# Patient Record
Sex: Female | Born: 1990 | Race: Black or African American | Hispanic: No | Marital: Married | Smoking: Current every day smoker
Health system: Southern US, Community
[De-identification: ages and names within clinical notes are randomized; demographics above are authoritative.]

## PROBLEM LIST (undated history)

## (undated) DIAGNOSIS — F419 Anxiety disorder, unspecified: Secondary | ICD-10-CM

## (undated) DIAGNOSIS — E119 Type 2 diabetes mellitus without complications: Secondary | ICD-10-CM

## (undated) DIAGNOSIS — I10 Essential (primary) hypertension: Secondary | ICD-10-CM

## (undated) DIAGNOSIS — F32A Depression, unspecified: Secondary | ICD-10-CM

---
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
## 2008-01-07 ENCOUNTER — Inpatient Hospital Stay: Payer: Self-pay | Admitting: *Deleted

## 2008-02-23 ENCOUNTER — Inpatient Hospital Stay: Payer: Self-pay | Admitting: Specialist

## 2008-11-29 ENCOUNTER — Inpatient Hospital Stay: Payer: Self-pay | Admitting: *Deleted

## 2009-03-01 ENCOUNTER — Ambulatory Visit: Payer: Self-pay | Admitting: Nurse Practitioner

## 2009-08-14 ENCOUNTER — Emergency Department: Payer: Self-pay | Admitting: Unknown Physician Specialty

## 2009-09-16 ENCOUNTER — Emergency Department: Payer: Self-pay | Admitting: Internal Medicine

## 2018-04-15 ENCOUNTER — Encounter: Payer: Self-pay | Admitting: Emergency Medicine

## 2018-04-15 ENCOUNTER — Other Ambulatory Visit: Payer: Self-pay

## 2018-04-15 ENCOUNTER — Emergency Department
Admission: EM | Admit: 2018-04-15 | Discharge: 2018-04-15 | Disposition: A | Discharge status: Home / Self Care | Payer: 59 | Attending: Emergency Medicine | Admitting: Emergency Medicine

## 2018-04-15 ENCOUNTER — Emergency Department: Payer: 59

## 2018-04-15 DIAGNOSIS — E109 Type 1 diabetes mellitus without complications: Secondary | ICD-10-CM | POA: Diagnosis not present

## 2018-04-15 DIAGNOSIS — Z794 Long term (current) use of insulin: Secondary | ICD-10-CM | POA: Insufficient documentation

## 2018-04-15 DIAGNOSIS — B0223 Postherpetic polyneuropathy: Secondary | ICD-10-CM | POA: Diagnosis not present

## 2018-04-15 DIAGNOSIS — R0781 Pleurodynia: Secondary | ICD-10-CM

## 2018-04-15 DIAGNOSIS — I10 Essential (primary) hypertension: Secondary | ICD-10-CM | POA: Insufficient documentation

## 2018-04-15 DIAGNOSIS — Z79899 Other long term (current) drug therapy: Secondary | ICD-10-CM | POA: Insufficient documentation

## 2018-04-15 DIAGNOSIS — R079 Chest pain, unspecified: Secondary | ICD-10-CM | POA: Diagnosis present

## 2018-04-15 DIAGNOSIS — F17210 Nicotine dependence, cigarettes, uncomplicated: Secondary | ICD-10-CM | POA: Diagnosis not present

## 2018-04-15 HISTORY — DX: Type 2 diabetes mellitus without complications: E11.9

## 2018-04-15 HISTORY — DX: Essential (primary) hypertension: I10

## 2018-04-15 LAB — URINALYSIS, COMPLETE (UACMP) WITH MICROSCOPIC
Bilirubin Urine: NEGATIVE
Glucose, UA: >=500 mg/dL — AB
KETONES UR: NEGATIVE mg/dL
Leukocytes,Ua: NEGATIVE
Nitrite: NEGATIVE
Protein, ur: 100 mg/dL — AB
Specific Gravity, Urine: 1.01 (ref 1.005–1.030)
pH: 6 (ref 5.0–8.0)

## 2018-04-15 LAB — POCT PREGNANCY, URINE: Preg Test, Ur: NEGATIVE

## 2018-04-15 MED ORDER — FAMCICLOVIR 500 MG PO TABS: 500.0000 mg | ORAL_TABLET | Freq: Three times a day (TID) | ORAL | 0 refills | Status: AC

## 2018-04-15 MED ORDER — HYDROCODONE-ACETAMINOPHEN 5-325 MG PO TABS: 1.0000 | ORAL_TABLET | Freq: Four times a day (QID) | ORAL | 0 refills | Status: DC | PRN

## 2018-04-15 NOTE — ED Notes (Signed)
See triage note  Presents with right sided rib/chest pain pain  Pain started about 1 week ago  W/o injury  Pain increases with cough or deep breathing

## 2018-04-15 NOTE — ED Triage Notes (Addendum)
Patient presents to the ED with right sided rib pain.  Patient states x 1 week she has had sharp pain to her right ribs.  Area is tender on palpation.  Patient states pain is worse with coughing, sneezing and laughing.  Patient denies any known injury.

## 2018-04-15 NOTE — Discharge Instructions (Addendum)
Follow-up with your regular doctor if not better in 3 days.  If the pain intensifies and you still do not have a rash in the area please return the emergency department.  Take the medications as prescribed.

## 2018-04-15 NOTE — ED Provider Notes (Signed)
[facility] Emergency Department Provider Note  ____________________________________________   First MD Initiated Contact with Patient 04/15/18 1713     (approximate)  I have reviewed the triage vital signs and the nursing notes.   HISTORY  Chief Complaint Chest Pain    HPI Cheyenne Davenport is a 28 y.o. female presents emergency department complaining of right-sided rib/flank pain.  She states the pain radiates around from the back to the front on the rib area.  She denies any known injury.  She has not had a lot of coughing.  She denies any fever or chills.  She denies any painful urination.    Past Medical History:  Diagnosis Date  . Diabetes mellitus without complication (HCC)    type 1   . Hypertension     There are no active problems to display for this patient.   Past Surgical History:  Procedure Laterality Date  . CESAREAN SECTION      Prior to Admission medications   Medication Sig Start Date End Date Taking? Authorizing Provider  ARIPiprazole (ABILIFY) 5 MG tablet Take 5 mg by mouth daily.   Yes [provider]  busPIRone (BUSPAR) 10 MG tablet Take 10 mg by mouth 3 (three) times daily.   Yes [provider]  DULoxetine (CYMBALTA) 60 MG capsule Take 60 mg by mouth daily.   Yes [provider]  insulin aspart (NOVOLOG) 100 UNIT/ML injection Inject into the skin 3 (three) times daily before meals.   Yes [provider]  Insulin Degludec (TRESIBA) 100 UNIT/ML SOLN Inject into the skin.   Yes [provider]  famciclovir (FAMVIR) 500 MG tablet Take 1 tablet (500 mg total) by mouth 3 (three) times daily for 7 days. 04/15/18 04/22/18  , Roselyn Bering, PA-C  HYDROcodone-acetaminophen (NORCO/VICODIN) 5-325 MG tablet Take 1 tablet by mouth every 6 (six) hours as needed for moderate pain. 04/15/18   Faythe Ghee, PA-C    Allergies Patient has no known allergies.  No family history on file.  Social  History Social History   Tobacco Use  . Smoking status: Current Every Day Smoker    Packs/day: 1.00    Types: Cigarettes  . Smokeless tobacco: Never Used  Substance Use Topics  . Alcohol use: Not on file  . Drug use: Not on file    Review of Systems  Constitutional: No fever/chills Eyes: No visual changes. ENT: No sore throat. Respiratory: Denies cough Genitourinary: Negative for dysuria. Musculoskeletal: Negative for back pain.  Positive for right rib pain Skin: Negative for rash.    ____________________________________________   PHYSICAL EXAM:  VITAL SIGNS: ED Triage Vitals [04/15/18 1703]  Enc Vitals Group     BP (!) 143/95     Pulse Rate 85     Resp 18     Temp 98.3 F (36.8 C)     Temp Source Oral     SpO2 100 %     Weight 250 lb (113.4 kg)     Height  (1.626 m)     Head Circumference      Peak Flow      Pain Score 0     Pain Loc      Pain Edu?      Excl. in [place]?     Constitutional: Alert and oriented. Well appearing and in no acute distress. Eyes: Conjunctivae are normal.  Head: Atraumatic. Nose: No congestion/rhinnorhea. Mouth/Throat: Mucous membranes are moist.   Neck:  supple no  lymphadenopathy noted Cardiovascular: Normal rate, regular rhythm. Heart sounds are normal Respiratory: Normal respiratory effort.  No retractions, lungs c t a  Abd: soft nontender bs normal all 4 quad, no CVA tenderness GU: deferred Musculoskeletal: FROM all extremities, warm and well perfused Neurologic:  Normal speech and language.  Skin:  Skin is warm, dry and intact. No rash noted.  No herpetic or vesicular lesions were noted on the skin Psychiatric: Mood and affect are normal. Speech and behavior are normal.  ____________________________________________   LABS (all labs ordered are listed, but only abnormal results are displayed)  Labs Reviewed  URINALYSIS, COMPLETE (UACMP) WITH MICROSCOPIC - Abnormal; Notable for the following components:      Result  Value   Color, Urine YELLOW (*)    APPearance CLEAR (*)    Glucose, UA >=500 (*)    Hgb urine dipstick MODERATE (*)    Protein, ur 100 (*)    Bacteria, UA RARE (*)    All other components within normal limits  URINE CULTURE  POC URINE PREG, ED  POCT PREGNANCY, URINE   ____________________________________________   ____________________________________________  RADIOLOGY  Chest x-ray is normal  ____________________________________________   PROCEDURES  Procedure(s) performed: No  Procedures    ____________________________________________   INITIAL IMPRESSION / ASSESSMENT AND PLAN / ED COURSE  Pertinent labs & imaging results that were available during my care of the patient were reviewed by me and considered in my medical decision making (see chart for details).   Patient is 28 year old female presents emergency department complaining of right-sided rib pain.  No injury.  No recent URI.  Physical exam shows the right rib area to be tender, abdomen is nontender, lungs are clear to all station.  Explained findings to the patient.  A UA and chest x-ray were ordered.  The UA shows a large amount of hemoglobin and protein due to her kidney disease from her diabetes.  The chest x-ray is normal.  Explained all of these findings to the patient.  I explained to her that sometimes shingles will show up as just pain and no rash.  We will empirically treat her for shingles even though she does not have a rash.  If her pain is worsening or she notices blood that is visible in the urine she should return emergency department.  She states she understands and will comply.  She was discharged in stable condition with a prescription for Famvir and Vicodin.     As part of my medical decision making, I reviewed the following data within the electronic MEDICAL RECORD NUMBER History obtained from family, Nursing notes reviewed and incorporated, Labs reviewed UA shows hemoglobin in the urine,  Old chart reviewed, Radiograph reviewed chest x-ray is normal, Notes from prior ED visits and [place] Controlled Substance Database  ____________________________________________   FINAL CLINICAL IMPRESSION(S) / ED DIAGNOSES  Final diagnoses:  Rib pain on right side  Shingles (herpes zoster) polyneuropathy      NEW MEDICATIONS STARTED DURING THIS VISIT:  New Prescriptions   FAMCICLOVIR (FAMVIR) 500 MG TABLET    Take 1 tablet (500 mg total) by mouth 3 (three) times daily for 7 days.   HYDROCODONE-ACETAMINOPHEN (NORCO/VICODIN) 5-325 MG TABLET    Take 1 tablet by mouth every 6 (six) hours as needed for moderate pain.     Note:  This document was prepared using Dragon voice recognition software and may include unintentional dictation errors.    Faythe Ghee, PA-C 04/15/18 1833    Derrill Kay,  Dustin Flock, MD 04/16/18 [phone]

## 2018-04-16 LAB — URINE CULTURE

## 2019-06-05 ENCOUNTER — Ambulatory Visit: Payer: 59 | Attending: Internal Medicine

## 2019-06-05 ENCOUNTER — Other Ambulatory Visit: Payer: Self-pay

## 2019-06-05 DIAGNOSIS — Z23 Encounter for immunization: Secondary | ICD-10-CM

## 2019-06-05 NOTE — Progress Notes (Signed)
   Covid-19 Vaccination Clinic  Name:  Cheyenne Davenport    MRN: [id number] DOB: 1990-09-21  06/05/2019  Cheyenne Davenport was observed post Covid-19 immunization for 15 minutes without incident. She was provided with Vaccine Information Sheet and instruction to access the V-Safe system.   Cheyenne Davenport was instructed to call 911 with any severe reactions post vaccine: Marland Kitchen Difficulty breathing  . Swelling of face and throat  . A fast heartbeat  . A bad rash all over body  . Dizziness and weakness   Immunizations Administered    Name Date Dose VIS Date Route   Moderna COVID-19 Vaccine 06/05/2019  3:34 PM 0.5 mL 12/2018 Intramuscular   Manufacturer: Gala Murdoch   Lot: [id number]   NDC: [id number]

## 2019-07-03 ENCOUNTER — Ambulatory Visit: Payer: 59 | Attending: Internal Medicine

## 2019-07-03 DIAGNOSIS — Z23 Encounter for immunization: Secondary | ICD-10-CM

## 2019-07-03 NOTE — Progress Notes (Signed)
   Covid-19 Vaccination Clinic  Name:  Cheyenne Davenport    MRN: [id number] DOB: April 02, 1990  07/03/2019  [name] was observed post Covid-19 immunization for 15 minutes without incident. She was provided with Vaccine Information Sheet and instruction to access the V-Safe system.   [name] was instructed to call 911 with any severe reactions post vaccine: Marland Kitchen Difficulty breathing  . Swelling of face and throat  . A fast heartbeat  . A bad rash all over body  . Dizziness and weakness   Immunizations Administered    Name Date Dose VIS Date Route   Moderna COVID-19 Vaccine 07/03/2019  3:33 PM 0.5 mL 12/2018 Intramuscular   Manufacturer: Moderna   Lot: [id number]   NDC: 78242-353-61

## 2020-12-08 ENCOUNTER — Emergency Department: Payer: BLUE CROSS/BLUE SHIELD

## 2020-12-08 ENCOUNTER — Inpatient Hospital Stay
Admission: EM | Admit: 2020-12-08 | Discharge: 2020-12-10 | DRG: 638 | Disposition: A | Discharge status: Home / Self Care | Payer: BLUE CROSS/BLUE SHIELD | Attending: Hospitalist | Admitting: Hospitalist

## 2020-12-08 ENCOUNTER — Encounter: Payer: Self-pay | Admitting: Emergency Medicine

## 2020-12-08 DIAGNOSIS — F17210 Nicotine dependence, cigarettes, uncomplicated: Secondary | ICD-10-CM | POA: Diagnosis present

## 2020-12-08 DIAGNOSIS — F32A Depression, unspecified: Secondary | ICD-10-CM | POA: Diagnosis present

## 2020-12-08 DIAGNOSIS — R17 Unspecified jaundice: Secondary | ICD-10-CM | POA: Diagnosis present

## 2020-12-08 DIAGNOSIS — Z79899 Other long term (current) drug therapy: Secondary | ICD-10-CM

## 2020-12-08 DIAGNOSIS — Z6836 Body mass index (BMI) 36.0-36.9, adult: Secondary | ICD-10-CM

## 2020-12-08 DIAGNOSIS — E282 Polycystic ovarian syndrome: Secondary | ICD-10-CM | POA: Diagnosis present

## 2020-12-08 DIAGNOSIS — Z794 Long term (current) use of insulin: Secondary | ICD-10-CM | POA: Diagnosis not present

## 2020-12-08 DIAGNOSIS — Z888 Allergy status to other drugs, medicaments and biological substances status: Secondary | ICD-10-CM

## 2020-12-08 DIAGNOSIS — Z20822 Contact with and (suspected) exposure to covid-19: Secondary | ICD-10-CM | POA: Diagnosis present

## 2020-12-08 DIAGNOSIS — Z833 Family history of diabetes mellitus: Secondary | ICD-10-CM | POA: Diagnosis not present

## 2020-12-08 DIAGNOSIS — E876 Hypokalemia: Secondary | ICD-10-CM | POA: Diagnosis present

## 2020-12-08 DIAGNOSIS — Z8249 Family history of ischemic heart disease and other diseases of the circulatory system: Secondary | ICD-10-CM | POA: Diagnosis not present

## 2020-12-08 DIAGNOSIS — Z7984 Long term (current) use of oral hypoglycemic drugs: Secondary | ICD-10-CM

## 2020-12-08 DIAGNOSIS — E785 Hyperlipidemia, unspecified: Secondary | ICD-10-CM | POA: Diagnosis present

## 2020-12-08 DIAGNOSIS — Z23 Encounter for immunization: Secondary | ICD-10-CM | POA: Diagnosis not present

## 2020-12-08 DIAGNOSIS — E1022 Type 1 diabetes mellitus with diabetic chronic kidney disease: Secondary | ICD-10-CM | POA: Diagnosis present

## 2020-12-08 DIAGNOSIS — F419 Anxiety disorder, unspecified: Secondary | ICD-10-CM | POA: Diagnosis present

## 2020-12-08 DIAGNOSIS — E669 Obesity, unspecified: Secondary | ICD-10-CM | POA: Diagnosis present

## 2020-12-08 DIAGNOSIS — N1831 Chronic kidney disease, stage 3a: Secondary | ICD-10-CM | POA: Diagnosis present

## 2020-12-08 DIAGNOSIS — I129 Hypertensive chronic kidney disease with stage 1 through stage 4 chronic kidney disease, or unspecified chronic kidney disease: Secondary | ICD-10-CM | POA: Diagnosis present

## 2020-12-08 DIAGNOSIS — R1084 Generalized abdominal pain: Secondary | ICD-10-CM

## 2020-12-08 DIAGNOSIS — N1330 Unspecified hydronephrosis: Secondary | ICD-10-CM | POA: Diagnosis present

## 2020-12-08 DIAGNOSIS — E1165 Type 2 diabetes mellitus with hyperglycemia: Secondary | ICD-10-CM | POA: Diagnosis present

## 2020-12-08 DIAGNOSIS — Z9114 Patient's other noncompliance with medication regimen: Secondary | ICD-10-CM | POA: Diagnosis not present

## 2020-12-08 DIAGNOSIS — E1010 Type 1 diabetes mellitus with ketoacidosis without coma: Secondary | ICD-10-CM | POA: Diagnosis present

## 2020-12-08 HISTORY — DX: Depression, unspecified: F32.A

## 2020-12-08 HISTORY — DX: Anxiety disorder, unspecified: F41.9

## 2020-12-08 LAB — COMPREHENSIVE METABOLIC PANEL
ALT: 16 U/L (ref 0–44)
AST: 17 U/L (ref 15–41)
Albumin: 3.6 g/dL (ref 3.5–5.0)
Alkaline Phosphatase: 66 U/L (ref 38–126)
Anion gap: 18 — ABNORMAL HIGH (ref 5–15)
BUN: 18 mg/dL (ref 6–20)
CO2: 18 mmol/L — ABNORMAL LOW (ref 22–32)
Calcium: 9.3 mg/dL (ref 8.9–10.3)
Chloride: 97 mmol/L — ABNORMAL LOW (ref 98–111)
Creatinine, Ser: 1.3 mg/dL — ABNORMAL HIGH (ref 0.44–1.00)
GFR, Estimated: 56 mL/min — ABNORMAL LOW (ref >60)
Glucose, Bld: 430 mg/dL — ABNORMAL HIGH (ref 70–99)
Potassium: 4 mmol/L (ref 3.5–5.1)
Sodium: 133 mmol/L — ABNORMAL LOW (ref 135–145)
Total Bilirubin: 1.3 mg/dL — ABNORMAL HIGH (ref 0.3–1.2)
Total Protein: 8.4 g/dL — ABNORMAL HIGH (ref 6.5–8.1)

## 2020-12-08 LAB — BLOOD GAS, VENOUS
Acid-base deficit: 7 mmol/L — ABNORMAL HIGH (ref 0.0–2.0)
Bicarbonate: 15.3 mmol/L — ABNORMAL LOW (ref 20.0–28.0)
O2 Saturation: 65.8 %
Patient temperature: 37
pCO2, Ven: 23 mmHg — ABNORMAL LOW (ref 44.0–60.0)
pH, Ven: 7.43 (ref 7.250–7.430)
pO2, Ven: 33 mmHg (ref 32.0–45.0)

## 2020-12-08 LAB — CBG MONITORING, ED
Glucose-Capillary: 393 mg/dL — ABNORMAL HIGH (ref 70–99)
Glucose-Capillary: 400 mg/dL — ABNORMAL HIGH (ref 70–99)

## 2020-12-08 LAB — CREATININE, SERUM
Creatinine, Ser: 1.17 mg/dL — ABNORMAL HIGH (ref 0.44–1.00)
GFR, Estimated: >60 mL/min (ref >60)

## 2020-12-08 LAB — BASIC METABOLIC PANEL
Anion gap: 17 — ABNORMAL HIGH (ref 5–15)
Anion gap: 17 — ABNORMAL HIGH (ref 5–15)
BUN: 18 mg/dL (ref 6–20)
BUN: 19 mg/dL (ref 6–20)
CO2: 16 mmol/L — ABNORMAL LOW (ref 22–32)
CO2: 17 mmol/L — ABNORMAL LOW (ref 22–32)
Calcium: 8.9 mg/dL (ref 8.9–10.3)
Calcium: 8.8 mg/dL — ABNORMAL LOW (ref 8.9–10.3)
Chloride: 102 mmol/L (ref 98–111)
Chloride: 102 mmol/L (ref 98–111)
Creatinine, Ser: 1.17 mg/dL — ABNORMAL HIGH (ref 0.44–1.00)
Creatinine, Ser: 1.23 mg/dL — ABNORMAL HIGH (ref 0.44–1.00)
GFR, Estimated: >60 mL/min (ref >60)
GFR, Estimated: 60 mL/min — ABNORMAL LOW (ref >60)
Glucose, Bld: 239 mg/dL — ABNORMAL HIGH (ref 70–99)
Glucose, Bld: 210 mg/dL — ABNORMAL HIGH (ref 70–99)
Potassium: 3.3 mmol/L — ABNORMAL LOW (ref 3.5–5.1)
Potassium: 3.3 mmol/L — ABNORMAL LOW (ref 3.5–5.1)
Sodium: 135 mmol/L (ref 135–145)
Sodium: 136 mmol/L (ref 135–145)

## 2020-12-08 LAB — HEMOGLOBIN A1C
Hgb A1c MFr Bld: 10.9 % — ABNORMAL HIGH (ref 4.8–5.6)
Mean Plasma Glucose: 266.13 mg/dL

## 2020-12-08 LAB — PREGNANCY, URINE: Preg Test, Ur: NEGATIVE

## 2020-12-08 LAB — URINALYSIS, ROUTINE W REFLEX MICROSCOPIC
Bilirubin Urine: NEGATIVE
Glucose, UA: >=500 mg/dL — AB
Ketones, ur: 20 mg/dL — AB
Leukocytes,Ua: NEGATIVE
Nitrite: NEGATIVE
Protein, ur: 100 mg/dL — AB
Specific Gravity, Urine: 1.025 (ref 1.005–1.030)
pH: 5 (ref 5.0–8.0)

## 2020-12-08 LAB — GLUCOSE, CAPILLARY
Glucose-Capillary: 118 mg/dL — ABNORMAL HIGH (ref 70–99)
Glucose-Capillary: 151 mg/dL — ABNORMAL HIGH (ref 70–99)
Glucose-Capillary: 173 mg/dL — ABNORMAL HIGH (ref 70–99)
Glucose-Capillary: 184 mg/dL — ABNORMAL HIGH (ref 70–99)
Glucose-Capillary: 200 mg/dL — ABNORMAL HIGH (ref 70–99)
Glucose-Capillary: 220 mg/dL — ABNORMAL HIGH (ref 70–99)

## 2020-12-08 LAB — CBC
HCT: 43.1 % (ref 36.0–46.0)
HCT: 39.6 % (ref 36.0–46.0)
Hemoglobin: 14.6 g/dL (ref 12.0–15.0)
Hemoglobin: 13.4 g/dL (ref 12.0–15.0)
MCH: 29.1 pg (ref 26.0–34.0)
MCH: 29.4 pg (ref 26.0–34.0)
MCHC: 33.9 g/dL (ref 30.0–36.0)
MCHC: 33.8 g/dL (ref 30.0–36.0)
MCV: 86 fL (ref 80.0–100.0)
MCV: 86.8 fL (ref 80.0–100.0)
Platelets: 338 10*3/uL (ref 150–400)
Platelets: 309 10*3/uL (ref 150–400)
RBC: 5.01 MIL/uL (ref 3.87–5.11)
RBC: 4.56 MIL/uL (ref 3.87–5.11)
RDW: 13.3 % (ref 11.5–15.5)
RDW: 13.1 % (ref 11.5–15.5)
WBC: 12.2 10*3/uL — ABNORMAL HIGH (ref 4.0–10.5)
WBC: 12.5 10*3/uL — ABNORMAL HIGH (ref 4.0–10.5)
nRBC: 0 % (ref 0.0–0.2)
nRBC: 0 % (ref 0.0–0.2)

## 2020-12-08 LAB — BETA-HYDROXYBUTYRIC ACID
Beta-Hydroxybutyric Acid: 4.61 mmol/L — ABNORMAL HIGH (ref 0.05–0.27)
Beta-Hydroxybutyric Acid: 5.67 mmol/L — ABNORMAL HIGH (ref 0.05–0.27)

## 2020-12-08 LAB — MRSA NEXT GEN BY PCR, NASAL: MRSA by PCR Next Gen: DETECTED — AB

## 2020-12-08 LAB — LIPASE, BLOOD: Lipase: 32 U/L (ref 11–51)

## 2020-12-08 LAB — RESP PANEL BY RT-PCR (FLU A&B, COVID) ARPGX2
Influenza A by PCR: NEGATIVE
Influenza B by PCR: NEGATIVE
SARS Coronavirus 2 by RT PCR: NEGATIVE

## 2020-12-08 LAB — MAGNESIUM: Magnesium: 1.8 mg/dL (ref 1.7–2.4)

## 2020-12-08 MED ORDER — LACTATED RINGERS IV SOLN: INTRAVENOUS | Status: DC

## 2020-12-08 MED ORDER — HYDROMORPHONE HCL 1 MG/ML IJ SOLN: 0.5000 mg | INTRAMUSCULAR | Status: DC | PRN

## 2020-12-08 MED ORDER — CHLORHEXIDINE GLUCONATE CLOTH 2 % EX PADS
6.0000 | MEDICATED_PAD | Freq: Every day | CUTANEOUS | Status: DC
  Filled 2020-12-08: qty 6

## 2020-12-08 MED ORDER — POTASSIUM CHLORIDE 10 MEQ/100ML IV SOLN
10.0000 meq | INTRAVENOUS | Status: AC
  Filled 2020-12-08: qty 100

## 2020-12-08 MED ORDER — ONDANSETRON HCL 4 MG/2ML IJ SOLN
4.0000 mg | Freq: Four times a day (QID) | INTRAMUSCULAR | Status: DC | PRN
  Administered 2020-12-09: 4 mg via INTRAVENOUS
  Filled 2020-12-08: qty 2

## 2020-12-08 MED ORDER — ENOXAPARIN SODIUM 60 MG/0.6ML IJ SOSY
0.5000 mg/kg | PREFILLED_SYRINGE | INTRAMUSCULAR | Status: DC
  Administered 2020-12-08: 50 mg via SUBCUTANEOUS
  Filled 2020-12-08: qty 0.5

## 2020-12-08 MED ORDER — POLYETHYLENE GLYCOL 3350 17 G PO PACK: 17.0000 g | PACK | Freq: Every day | ORAL | Status: DC | PRN

## 2020-12-08 MED ORDER — LACTATED RINGERS IV BOLUS: 20.0000 mL/kg | Freq: Once | INTRAVENOUS | Status: DC

## 2020-12-08 MED ORDER — SODIUM CHLORIDE 0.9 % IV SOLN
12.5000 mg | Freq: Four times a day (QID) | INTRAVENOUS | Status: DC | PRN
  Administered 2020-12-09: 12.5 mg via INTRAVENOUS
  Filled 2020-12-08: qty 12.5
  Filled 2020-12-08: qty 0.5

## 2020-12-08 MED ORDER — OXYCODONE HCL 5 MG PO TABS: 5.0000 mg | ORAL_TABLET | ORAL | Status: DC | PRN

## 2020-12-08 MED ORDER — HYDRALAZINE HCL 10 MG PO TABS
10.0000 mg | ORAL_TABLET | Freq: Three times a day (TID) | ORAL | Status: DC
  Administered 2020-12-08 – 2020-12-10 (×5): 10 mg via ORAL
  Filled 2020-12-08 (×8): qty 1

## 2020-12-08 MED ORDER — NIFEDIPINE ER OSMOTIC RELEASE 30 MG PO TB24
90.0000 mg | ORAL_TABLET | Freq: Every day | ORAL | Status: DC
  Administered 2020-12-09 – 2020-12-10 (×2): 90 mg via ORAL
  Filled 2020-12-08 (×2): qty 1
  Filled 2020-12-08: qty 3
  Filled 2020-12-08 (×7): qty 1

## 2020-12-08 MED ORDER — MUPIROCIN 2 % EX OINT
1.0000 "application " | TOPICAL_OINTMENT | Freq: Two times a day (BID) | CUTANEOUS | Status: DC
  Administered 2020-12-08 – 2020-12-09 (×3): 1 via NASAL
  Filled 2020-12-08: qty 22

## 2020-12-08 MED ORDER — PROCHLORPERAZINE EDISYLATE 10 MG/2ML IJ SOLN
10.0000 mg | Freq: Once | INTRAMUSCULAR | Status: AC
  Administered 2020-12-08: 10 mg via INTRAVENOUS
  Filled 2020-12-08: qty 2

## 2020-12-08 MED ORDER — ONDANSETRON HCL 4 MG/2ML IJ SOLN
4.0000 mg | Freq: Once | INTRAMUSCULAR | Status: AC
  Administered 2020-12-08: 4 mg via INTRAVENOUS
  Filled 2020-12-08: qty 2

## 2020-12-08 MED ORDER — LACTATED RINGERS IV BOLUS
1000.0000 mL | Freq: Once | INTRAVENOUS | Status: AC
  Administered 2020-12-08: 1000 mL via INTRAVENOUS

## 2020-12-08 MED ORDER — INSULIN REGULAR(HUMAN) IN NACL 100-0.9 UT/100ML-% IV SOLN
INTRAVENOUS | Status: DC
  Administered 2020-12-08: 1.1 [IU]/h via INTRAVENOUS

## 2020-12-08 MED ORDER — CARVEDILOL 25 MG PO TABS
25.0000 mg | ORAL_TABLET | Freq: Two times a day (BID) | ORAL | Status: DC
  Administered 2020-12-09 – 2020-12-10 (×3): 25 mg via ORAL
  Filled 2020-12-08: qty 2
  Filled 2020-12-08 (×2): qty 1

## 2020-12-08 MED ORDER — CHLORHEXIDINE GLUCONATE CLOTH 2 % EX PADS
6.0000 | MEDICATED_PAD | Freq: Every day | CUTANEOUS | Status: DC
  Administered 2020-12-09 – 2020-12-10 (×2): 6 via TOPICAL

## 2020-12-08 MED ORDER — POTASSIUM CHLORIDE 10 MEQ/100ML IV SOLN
10.0000 meq | INTRAVENOUS | Status: AC
  Administered 2020-12-08 (×2): 10 meq via INTRAVENOUS
  Filled 2020-12-08: qty 100

## 2020-12-08 MED ORDER — ENOXAPARIN SODIUM 40 MG/0.4ML IJ SOSY: 40.0000 mg | PREFILLED_SYRINGE | INTRAMUSCULAR | Status: DC

## 2020-12-08 MED ORDER — SERTRALINE HCL 50 MG PO TABS
100.0000 mg | ORAL_TABLET | Freq: Every day | ORAL | Status: DC
  Administered 2020-12-09 – 2020-12-10 (×2): 100 mg via ORAL
  Filled 2020-12-08 (×2): qty 2

## 2020-12-08 MED ORDER — DEXTROSE 50 % IV SOLN: 0.0000 mL | INTRAVENOUS | Status: DC | PRN

## 2020-12-08 MED ORDER — IOHEXOL 300 MG/ML  SOLN
100.0000 mL | Freq: Once | INTRAMUSCULAR | Status: AC | PRN
  Administered 2020-12-08: 100 mL via INTRAVENOUS
  Filled 2020-12-08: qty 100

## 2020-12-08 MED ORDER — MORPHINE SULFATE (PF) 4 MG/ML IV SOLN
4.0000 mg | Freq: Once | INTRAVENOUS | Status: AC
  Administered 2020-12-08: 4 mg via INTRAVENOUS
  Filled 2020-12-08: qty 1

## 2020-12-08 MED ORDER — ACETAMINOPHEN 325 MG PO TABS: 650.0000 mg | ORAL_TABLET | Freq: Four times a day (QID) | ORAL | Status: DC | PRN

## 2020-12-08 MED ORDER — DEXTROSE IN LACTATED RINGERS 5 % IV SOLN: INTRAVENOUS | Status: DC

## 2020-12-08 MED ORDER — PNEUMOCOCCAL VAC POLYVALENT 25 MCG/0.5ML IJ INJ
0.5000 mL | INJECTION | INTRAMUSCULAR | Status: AC
  Administered 2020-12-09: 0.5 mL via INTRAMUSCULAR
  Filled 2020-12-08: qty 0.5

## 2020-12-08 MED ORDER — BUSPIRONE HCL 10 MG PO TABS
10.0000 mg | ORAL_TABLET | Freq: Two times a day (BID) | ORAL | Status: DC
  Administered 2020-12-08 – 2020-12-10 (×4): 10 mg via ORAL
  Filled 2020-12-08 (×6): qty 1

## 2020-12-08 MED ORDER — INSULIN REGULAR(HUMAN) IN NACL 100-0.9 UT/100ML-% IV SOLN
INTRAVENOUS | Status: DC
  Administered 2020-12-08: 10.5 [IU]/h via INTRAVENOUS
  Filled 2020-12-08: qty 100

## 2020-12-08 MED ORDER — TRAZODONE HCL 50 MG PO TABS
50.0000 mg | ORAL_TABLET | Freq: Every evening | ORAL | Status: DC | PRN
  Administered 2020-12-08 – 2020-12-09 (×2): 50 mg via ORAL
  Filled 2020-12-08 (×2): qty 1

## 2020-12-08 MED ORDER — METFORMIN HCL ER 500 MG PO TB24
1000.0000 mg | ORAL_TABLET | Freq: Two times a day (BID) | ORAL | Status: DC
  Filled 2020-12-08: qty 2

## 2020-12-08 NOTE — ED Notes (Signed)
REPIORT CALLED TO RUSSEL RN ALL QUESTIONS ANSWERED

## 2020-12-08 NOTE — Progress Notes (Signed)
PHARMACIST - PHYSICIAN COMMUNICATION  CONCERNING:  Enoxaparin (Lovenox) for DVT Prophylaxis    RECOMMENDATION: Patient was prescribed enoxaprin 40mg  q24 hours for VTE prophylaxis.   Filed Weights   12/08/20 1311  Weight: 97.5 kg (215 lb)    Body mass index is 36.9 kg/m.  Estimated Creatinine Clearance: 70.4 mL/min (A) (by C-G formula based on SCr of 1.3 mg/dL (H)).   Based on [facility] policy patient is candidate for enoxaparin 0.5mg /kg TBW SQ every 24 hours based on BMI being >30.   DESCRIPTION: Pharmacy has adjusted enoxaparin dose per [facility] policy.  Patient is now receiving enoxaparin 50 mg every 24 hours    [facility], PharmD Clinical Pharmacist  12/08/2020 4:33 PM

## 2020-12-08 NOTE — ED Provider Notes (Signed)
[facility] Emergency Department Provider Note   ____________________________________________   Event Date/Time   First MD Initiated Contact with Patient 12/08/20 1345     (approximate)  I have reviewed the triage vital signs and the nursing notes.   HISTORY  Chief Complaint Emesis and Diarrhea    HPI Cheyenne Davenport is a 30 y.o. female with past medical history of hypertension and diabetes who presents to the ED complaining of abdominal pain and vomiting.  Patient reports that she has had 2 days of worsening nausea, vomiting, and diarrhea.  She has not noticed any blood in her emesis or stool.  She reports increasing abdominal pain over the course of today which affects her abdomen diffusely.  Pain is constant and sharp, has become severe since arrival to the ED.  She has not had any fevers, cough, chest pain, shortness of breath, dysuria, or flank pain.  She is not aware of any sick contacts.  She states she was tolerating her medications up until earlier today, has not had her blood pressure medications or insulin today.  She is a type I diabetic and reports episodes of DKA in the past, but current symptoms do not feel similar.        Past Medical History:  Diagnosis Date   Anxiety    Depression    Diabetes mellitus without complication (HCC)    type 1    Hypertension     There are no problems to display for this patient.   Past Surgical History:  Procedure Laterality Date   CESAREAN SECTION      Prior to Admission medications   Medication Sig Start Date End Date Taking? Authorizing Provider  ARIPiprazole (ABILIFY) 5 MG tablet Take 5 mg by mouth daily.    [provider]  busPIRone (BUSPAR) 10 MG tablet Take 10 mg by mouth 3 (three) times daily.    [provider]  DULoxetine (CYMBALTA) 60 MG capsule Take 60 mg by mouth daily.    [provider]  HYDROcodone-acetaminophen (NORCO/VICODIN) 5-325 MG tablet Take 1  tablet by mouth every 6 (six) hours as needed for moderate pain. 04/15/18   Fisher, Roselyn Bering, PA-C  insulin aspart (NOVOLOG) 100 UNIT/ML injection Inject into the skin 3 (three) times daily before meals.    [provider]  Insulin Degludec (TRESIBA) 100 UNIT/ML SOLN Inject into the skin.    [provider]    Allergies Lisinopril  No family history on file.  Social History Social History   Tobacco Use   Smoking status: Every Day    Packs/day: 1.00    Types: Cigarettes   Smokeless tobacco: Never    Review of Systems  Constitutional: No fever/chills Eyes: No visual changes. ENT: No sore throat. Cardiovascular: Denies chest pain. Respiratory: Denies shortness of breath. Gastrointestinal: Positive for abdominal pain, nausea, vomiting, and diarrhea.  No constipation. Genitourinary: Negative for dysuria. Musculoskeletal: Negative for back pain. Skin: Negative for rash. Neurological: Negative for headaches, focal weakness or numbness.  ____________________________________________   PHYSICAL EXAM:  VITAL SIGNS: ED Triage Vitals  Enc Vitals Group     BP 12/08/20 1310 (!) 164/115     Pulse Rate 12/08/20 1310 93     Resp 12/08/20 1310 18     Temp 12/08/20 1310 98.6 F (37 C)     Temp Source 12/08/20 1310 Oral     SpO2 12/08/20 1310 100 %     Weight 12/08/20 1311 215 lb (97.5 kg)  Height 12/08/20 1311 5\' 4"  (1.626 m)     Head Circumference --      Peak Flow --      Pain Score 12/08/20 1311 8     Pain Loc --      Pain Edu? --      Excl. in [place]? --     Constitutional: Alert and oriented. Eyes: Conjunctivae are normal. Head: Atraumatic. Nose: No congestion/rhinnorhea. Mouth/Throat: Mucous membranes are moist. Neck: Normal ROM Cardiovascular: Normal rate, regular rhythm. Grossly normal heart sounds.  2+ radial pulses bilaterally. Respiratory: Tachypneic with normal respiratory effort.  No retractions. Lungs CTAB. Gastrointestinal: Soft and  diffusely tender to palpation with no rebound or guarding.  No distention. Genitourinary: deferred Musculoskeletal: No lower extremity tenderness nor edema. Neurologic:  Normal speech and language. No gross focal neurologic deficits are appreciated. Skin:  Skin is warm, dry and intact. No rash noted. Psychiatric: Mood and affect are normal. Speech and behavior are normal.  ____________________________________________   LABS (all labs ordered are listed, but only abnormal results are displayed)  Labs Reviewed  COMPREHENSIVE METABOLIC PANEL - Abnormal; Notable for the following components:      Result Value   Sodium 133 (*)    Chloride 97 (*)    CO2 18 (*)    Glucose, Bld 430 (*)    Creatinine, Ser 1.30 (*)    Total Protein 8.4 (*)    Total Bilirubin 1.3 (*)    GFR, Estimated 56 (*)    Anion gap 18 (*)    All other components within normal limits  CBC - Abnormal; Notable for the following components:   WBC 12.2 (*)    All other components within normal limits  URINALYSIS, ROUTINE W REFLEX MICROSCOPIC - Abnormal; Notable for the following components:   Color, Urine YELLOW (*)    APPearance CLEAR (*)    Glucose, UA >=500 (*)    Hgb urine dipstick MODERATE (*)    Ketones, ur 20 (*)    Protein, ur 100 (*)    Bacteria, UA RARE (*)    All other components within normal limits  BLOOD GAS, VENOUS - Abnormal; Notable for the following components:   pCO2, Ven 23 (*)    Bicarbonate 15.3 (*)    Acid-base deficit 7.0 (*)    All other components within normal limits  BETA-HYDROXYBUTYRIC ACID - Abnormal; Notable for the following components:   Beta-Hydroxybutyric Acid 4.61 (*)    All other components within normal limits  CBG MONITORING, ED - Abnormal; Notable for the following components:   Glucose-Capillary 393 (*)    All other components within normal limits  CBG MONITORING, ED - Abnormal; Notable for the following components:   Glucose-Capillary 400 (*)    All other components  within normal limits  RESP PANEL BY RT-PCR (FLU A&B, COVID) ARPGX2  LIPASE, BLOOD  PREGNANCY, URINE  MAGNESIUM  POC URINE PREG, ED   ____________________________________________  EKG  ED ECG REPORT I, Blake Divine, the attending physician, personally viewed and interpreted this ECG.   Date: 12/08/2020  EKG Time: 15:05  Rate: 90  Rhythm: normal sinus rhythm  Axis: Normal  Intervals:none  ST&T Change: None   PROCEDURES  Procedure(s) performed (including Critical Care):  .Critical Care Performed by: Blake Divine, MD Authorized by: Blake Divine, MD   Critical care provider statement:    Critical care time (minutes):  45   Critical care time was exclusive of:  Separately billable procedures and treating other patients  and teaching time   Critical care was necessary to treat or prevent imminent or life-threatening deterioration of the following conditions:  Endocrine crisis and metabolic crisis   Critical care was time spent personally by me on the following activities:  Ordering and performing treatments and interventions, ordering and review of laboratory studies, ordering and review of radiographic studies, pulse oximetry, re-evaluation of patient's condition, review of old charts, obtaining history from patient or surrogate, examination of patient, evaluation of patient's response to treatment and development of treatment plan with patient or surrogate   I assumed direction of critical care for this patient from another provider in my specialty: no     Care discussed with: admitting provider     ____________________________________________   INITIAL IMPRESSION / [place] / ED COURSE      30 year old female with past medical history of hypertension and type 1 diabetes who presents to the ED with 2 days of nausea, vomiting, and diarrhea now associated with increasing abdominal pain over the course of the day today.  Patient is tachypneic but with normal  respiratory effort, maintaining O2 sats on room air.  She is diffusely tender to palpation on exam of her abdomen.  Initial labs are concerning for DKA with hyperglycemia, increased anion gap, and acidosis.  We will give bolus of IV fluids and start insulin drip with supplemental potassium.  It is unclear if gastroenteritis precipitated her DKA with associated abdominal pain or whether there is another cause of her abdominal pain precipitating DKA.  We will further assess with CT scan, pregnancy testing is pending at this time.  We will treat symptomatically with IV morphine and Zofran.  CT scan appears consistent with colitis but no other findings to account for patient's severe abdominal pain.  She is starting to feel better following IV fluids, insulin drip, and additional nausea medication.  UA shows no signs of infection and EKG is unremarkable.  Case discussed with hospitalist for admission for further management of DKA.      ____________________________________________   FINAL CLINICAL IMPRESSION(S) / ED DIAGNOSES  Final diagnoses:  Diabetic ketoacidosis without coma associated with type 1 diabetes mellitus ([place])  Generalized abdominal pain     ED Discharge Orders     None        Note:  This document was prepared using Dragon voice recognition software and may include unintentional dictation errors.    Blake Divine, MD 12/08/20 [phone]

## 2020-12-08 NOTE — ED Triage Notes (Signed)
Pt reports n/v/d since Monday and not being able to take her meds. Hx of DM and HTN.

## 2020-12-08 NOTE — H&P (Addendum)
History and Physical  Cheyenne Davenport RK:[id number] DOB: 1990-10-02 DOA: 12/08/2020  Referring physician: Dr. Charna Archer, [place] PCP: Rutherford Limerick, PA  Outpatient Specialists: Endocrinology, nephrology, gynecology, psychiatry. Patient coming from: Home.  Chief Complaint: Nausea, vomiting, abdominal pain x2 days.  HPI: Cheyenne Davenport is a 30 y.o. female with medical history significant for type 1 diabetes, essential hypertension, PCOS on metformin, hyperlipidemia, chronic anxiety/depression, obesity, who presented to [facility] ED with complaints of nausea, vomiting, abdominal pain, and watery stools of 3 days duration.  No subjective fevers but admits to chills.  Has been unable to keep anything down for the past 2 days including her home medications.  Her abdominal pain has been worsening for the past 24 hours.  It is diffuse, sharp and constant.  Associated with 3 days of watery stools with increase frequency.  At the time of this visit she has had 3 watery stools since this morning.  Upon presentation to the ED, she was in severe pain.  Work-up revealed DKA type I, mild colitis, and mild right hydronephrosis with no obstructing lesion identified such as a stone.  She was started on DKA protocol with IV fluid hydration in the ED.  EDP requested admission for further evaluation and management of her present condition.  ED Course: Temperature 98.6.  BP 154/70, pulse 85, respiration rate 17, saturation 98% on room air.  Lab studies remarkable for serum sodium 133, serum bicarb 18, glucose 430, creatinine 1.30, GFR 56, anion gap 18, bilirubin 1.3.  WBC 12.2.  Review of Systems: Review of systems as noted in the HPI. All other systems reviewed and are negative.   Past Medical History:  Diagnosis Date   Anxiety    Depression    Diabetes mellitus without complication (Cheyenne Davenport)    type 1    Hypertension    Past Surgical History:  Procedure Laterality Date   CESAREAN SECTION      Social History:  reports  that she has been smoking cigarettes. She has been smoking an average of 1 pack per day. She has never used smokeless tobacco. No history on file for alcohol use and drug use.   Allergies  Allergen Reactions   Lisinopril Nausea Only    Family history: Mother with history of diabetes and hypertension Father with history of diabetes and hypertension Sister with history of hypertension.    Prior to Admission medications   Medication Sig Start Date End Date Taking? Authorizing Provider  ARIPiprazole (ABILIFY) 5 MG tablet Take 5 mg by mouth daily.    [provider]  busPIRone (BUSPAR) 10 MG tablet Take 10 mg by mouth 3 (three) times daily.    [provider]  DULoxetine (CYMBALTA) 60 MG capsule Take 60 mg by mouth daily.    [provider]  HYDROcodone-acetaminophen (NORCO/VICODIN) 5-325 MG tablet Take 1 tablet by mouth every 6 (six) hours as needed for moderate pain. 04/15/18   Cheyenne, Linden Dolin, PA-C  insulin aspart (NOVOLOG) 100 UNIT/ML injection Inject into the skin 3 (three) times daily before meals.    [provider]  Insulin Degludec (TRESIBA) 100 UNIT/ML SOLN Inject into the skin.    [provider]    Physical Exam: BP (!) 155/70 (BP Location: Right Arm)   Pulse 85   Temp 98.3 F (36.8 C) (Oral)   Resp 17   Ht 5\' 4"  (1.626 m)   Wt 97.5 kg   SpO2 98%   BMI 36.90 kg/m   General: 30 y.o. year-old  female well developed well nourished in no acute distress.  Alert and oriented x3. Cardiovascular: Regular rate and rhythm with no rubs or gallops.  No thyromegaly or JVD noted.  No lower extremity edema. 2/4 pulses in all 4 extremities. Respiratory: Clear to auscultation with no wheezes or rales. Good inspiratory effort. Abdomen: Soft diffuse tenderness nondistended with normal bowel sounds x4 quadrants. Muskuloskeletal: No cyanosis, clubbing or edema noted bilaterally Neuro: CN II-XII intact, strength, sensation, reflexes Skin: No  ulcerative lesions noted or rashes Psychiatry: Judgement and insight appear normal. Mood is appropriate for condition and setting          Labs on Admission:  Basic Metabolic Panel: Recent Labs  Lab 12/08/20 1316 12/08/20 1406  NA 133*  --   K 4.0  --   CL 97*  --   CO2 18*  --   GLUCOSE 430*  --   BUN 18  --   CREATININE 1.30*  --   CALCIUM 9.3  --   MG  --  1.8   Liver Function Tests: Recent Labs  Lab 12/08/20 1316  AST 17  ALT 16  ALKPHOS 66  BILITOT 1.3*  PROT 8.4*  ALBUMIN 3.6   Recent Labs  Lab 12/08/20 1316  LIPASE 32   No results for input(s): AMMONIA in the last 168 hours. CBC: Recent Labs  Lab 12/08/20 1316  WBC 12.2*  HGB 14.6  HCT 43.1  MCV 86.0  PLT 338   Cardiac Enzymes: No results for input(s): CKTOTAL, CKMB, CKMBINDEX, TROPONINI in the last 168 hours.  BNP (last 3 results) No results for input(s): BNP in the last 8760 hours.  ProBNP (last 3 results) No results for input(s): PROBNP in the last 8760 hours.  CBG: Recent Labs  Lab 12/08/20 1314 12/08/20 1550  GLUCAP 393* 400*    Radiological Exams on Admission: CT Abdomen Pelvis W Contrast  Result Date: 12/08/2020 CLINICAL DATA:  Abdominal pain, acute, nonlocalized EXAM: CT ABDOMEN AND PELVIS WITH CONTRAST TECHNIQUE: Multidetector CT imaging of the abdomen and pelvis was performed using the standard protocol following bolus administration of intravenous contrast. CONTRAST:  115mL OMNIPAQUE IOHEXOL 300 MG/ML  SOLN COMPARISON:  None. FINDINGS: Lower chest: No acute abnormality. Hepatobiliary: No focal liver abnormality is seen. No gallstones, gallbladder wall thickening, or biliary dilatation. Pancreas: Unremarkable. No pancreatic ductal dilatation or surrounding inflammatory changes. Spleen: Normal in size without focal abnormality. Adrenals/Urinary Tract: Adrenal glands are unremarkable. There is mild right pelvicaliectasis with decompressed ureter and no evidence of obstructing stone.  Bladder is unremarkable. Stomach/Bowel: The stomach is within normal limits. There is no evidence of bowel obstruction. The appendix is normal. There is diffuse submucosal thickening of the colon including the rectum, with relative sparing of the sigmoid colon. Vascular/Lymphatic: No significant vascular findings are present. No enlarged abdominal or pelvic lymph nodes. Reproductive: Unremarkable. Other: No abdominal wall hernia or abnormality. No abdominopelvic ascites. Musculoskeletal: No acute osseous abnormality. No suspicious lytic or blastic lesions. Mild degenerative disc height loss at L5-S1. IMPRESSION: Mild diffuse colonic wall thickening including the rectum, with relative sparing of the sigmoid colon. This is consistent with mild colitis. No evidence of bowel obstruction.  Normal appendix. Mild right hydronephrosis with decompressed ureter and no obstructing lesion identified such as a stone. 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 This could be further evaluated with a Mag-3 nuclear medicine renal study and/or CT urogram. Electronically Signed   By: Maurine Simmering M.D.   On: 12/08/2020 15:45  EKG: I independently viewed the EKG done and my findings are as followed: Sinus rhythm rate of 90, nonspecific ST-T changes.  QTc 467.  Assessment/Plan Present on Admission:  DKA, type 1 (HCC)  Active Problems:   DKA, type 1 (HCC)  DKA type I possibly precipitated by colitis Presented with nausea vomiting and inability to tolerate po or take her medications. Did not take her insulin today. Mild colitis seen on CT scan. Serum bicarb 18, anion gap 18. Serum glucose 430 Beta hydroxybutyric acid 4.61 Obtain hemoglobin A1c Started on DKA protocol in the ED, continue Continue IV fluid hydration Transition to subcu insulin when appropriate. BMP every 4 hours, beta hydroxybutyrate acid every 8 hours. Replete electrolytes as indicated  Anion gap metabolic acidosis in the setting of  DKA type I. Presented with serum bicarb of 18 and anion gap of 18 Treat underlying conditions Continue IV fluid hydration. Repeat BMP every 4 hours until anion gap closes.  Newly diagnosed mild colitis/intractable abdominal pain Lipase normal, 32. Endorses watery stools for the past 3 days. Afebrile, non septic appearing Monitor for now off antibiotics Obtain stool studies. Monitor fever curve and WBC Treat symptomatically, pain control, IV fluid hydration, optimize electrolytes. Consult GI  Newly diagnosed mild right hydronephrosis seen on CT scan CT scan shows mild right hydronephrosis with decompressed ureter and no obstructing lesions identified such as a stone. Monitor urine output Consult urology  CKD 3A She appears to be at her baseline creatinine 1.3 with GFR 56 Avoid nephrotoxic agents, dehydration and hypotension Monitor urine output Repeat BMP  Chronic anxiety/depression Resume home duloxetine and home buspirone. She follows with psychiatry outpatient  Obesity BMI 36 Recommend weight loss outpatient with regular physical activity and healthy dieting.  PCOS on metformin She follows with gynecology outpatient Patient has 1 child Urine pregnancy negative.  Pseudohyponatremia Corrected sodium 133 for hyperglycemia 430, is 138 Continue LR IV fluid hydration.  Hyperbilirubinemia, isolated Alkaline phosphatase, ALT, AST normal T bili 1.3 Monitor for now     Critical care time: 65 minutes   DVT prophylaxis: Subcu Lovenox daily  Code Status: Full code  Family Communication: None at bedside  Disposition Plan: Admitted to stepdown unit for insulin drip.  Consults called: Urology, GI, Diabetes coordinator via Epic.  Admission status: Inpatient status.  Patient will require at least 2 midnights for further evaluation and treatment of present condition.   Status is: Inpatient       Darlin Drop MD [facility] Pager [phone]  If  7PM-7AM, please contact night-coverage [email or web address] Password [email or web address]  12/08/2020, 4:11 PM

## 2020-12-09 LAB — COMPREHENSIVE METABOLIC PANEL
ALT: 11 U/L (ref 0–44)
AST: 13 U/L — ABNORMAL LOW (ref 15–41)
Albumin: 2.9 g/dL — ABNORMAL LOW (ref 3.5–5.0)
Alkaline Phosphatase: 48 U/L (ref 38–126)
Anion gap: 9 (ref 5–15)
BUN: 20 mg/dL (ref 6–20)
CO2: 20 mmol/L — ABNORMAL LOW (ref 22–32)
Calcium: 8.3 mg/dL — ABNORMAL LOW (ref 8.9–10.3)
Chloride: 103 mmol/L (ref 98–111)
Creatinine, Ser: 1.18 mg/dL — ABNORMAL HIGH (ref 0.44–1.00)
GFR, Estimated: >60 mL/min (ref >60)
Glucose, Bld: 151 mg/dL — ABNORMAL HIGH (ref 70–99)
Potassium: 3.3 mmol/L — ABNORMAL LOW (ref 3.5–5.1)
Sodium: 132 mmol/L — ABNORMAL LOW (ref 135–145)
Total Bilirubin: 1 mg/dL (ref 0.3–1.2)
Total Protein: 6.7 g/dL (ref 6.5–8.1)

## 2020-12-09 LAB — BASIC METABOLIC PANEL
Anion gap: 8 (ref 5–15)
Anion gap: 14 (ref 5–15)
BUN: 19 mg/dL (ref 6–20)
BUN: 19 mg/dL (ref 6–20)
CO2: 22 mmol/L (ref 22–32)
CO2: 20 mmol/L — ABNORMAL LOW (ref 22–32)
Calcium: 8.3 mg/dL — ABNORMAL LOW (ref 8.9–10.3)
Calcium: 8.8 mg/dL — ABNORMAL LOW (ref 8.9–10.3)
Chloride: 104 mmol/L (ref 98–111)
Chloride: 101 mmol/L (ref 98–111)
Creatinine, Ser: 1.31 mg/dL — ABNORMAL HIGH (ref 0.44–1.00)
Creatinine, Ser: 1.36 mg/dL — ABNORMAL HIGH (ref 0.44–1.00)
GFR, Estimated: 56 mL/min — ABNORMAL LOW (ref >60)
GFR, Estimated: 53 mL/min — ABNORMAL LOW (ref >60)
Glucose, Bld: 159 mg/dL — ABNORMAL HIGH (ref 70–99)
Glucose, Bld: 272 mg/dL — ABNORMAL HIGH (ref 70–99)
Potassium: 3.2 mmol/L — ABNORMAL LOW (ref 3.5–5.1)
Potassium: 4.1 mmol/L (ref 3.5–5.1)
Sodium: 134 mmol/L — ABNORMAL LOW (ref 135–145)
Sodium: 135 mmol/L (ref 135–145)

## 2020-12-09 LAB — GLUCOSE, CAPILLARY
Glucose-Capillary: 149 mg/dL — ABNORMAL HIGH (ref 70–99)
Glucose-Capillary: 161 mg/dL — ABNORMAL HIGH (ref 70–99)
Glucose-Capillary: 141 mg/dL — ABNORMAL HIGH (ref 70–99)
Glucose-Capillary: 177 mg/dL — ABNORMAL HIGH (ref 70–99)
Glucose-Capillary: 131 mg/dL — ABNORMAL HIGH (ref 70–99)
Glucose-Capillary: 217 mg/dL — ABNORMAL HIGH (ref 70–99)
Glucose-Capillary: 277 mg/dL — ABNORMAL HIGH (ref 70–99)
Glucose-Capillary: 153 mg/dL — ABNORMAL HIGH (ref 70–99)
Glucose-Capillary: 234 mg/dL — ABNORMAL HIGH (ref 70–99)

## 2020-12-09 LAB — BETA-HYDROXYBUTYRIC ACID: Beta-Hydroxybutyric Acid: 2.85 mmol/L — ABNORMAL HIGH (ref 0.05–0.27)

## 2020-12-09 LAB — CBC
HCT: 35.1 % — ABNORMAL LOW (ref 36.0–46.0)
Hemoglobin: 11.9 g/dL — ABNORMAL LOW (ref 12.0–15.0)
MCH: 29.2 pg (ref 26.0–34.0)
MCHC: 33.9 g/dL (ref 30.0–36.0)
MCV: 86 fL (ref 80.0–100.0)
Platelets: 320 10*3/uL (ref 150–400)
RBC: 4.08 MIL/uL (ref 3.87–5.11)
RDW: 13.3 % (ref 11.5–15.5)
WBC: 12 10*3/uL — ABNORMAL HIGH (ref 4.0–10.5)
nRBC: 0 % (ref 0.0–0.2)

## 2020-12-09 LAB — HIV ANTIBODY (ROUTINE TESTING W REFLEX): HIV Screen 4th Generation wRfx: NONREACTIVE

## 2020-12-09 LAB — PHOSPHORUS: Phosphorus: 3.4 mg/dL (ref 2.5–4.6)

## 2020-12-09 LAB — MAGNESIUM: Magnesium: 1.6 mg/dL — ABNORMAL LOW (ref 1.7–2.4)

## 2020-12-09 MED ORDER — INSULIN ASPART 100 UNIT/ML IJ SOLN
0.0000 [IU] | Freq: Three times a day (TID) | INTRAMUSCULAR | Status: DC
  Administered 2020-12-09: 2 [IU] via SUBCUTANEOUS
  Administered 2020-12-09: 3 [IU] via SUBCUTANEOUS
  Administered 2020-12-10: 2 [IU] via SUBCUTANEOUS
  Administered 2020-12-10: 12:00:00 3 [IU] via SUBCUTANEOUS
  Filled 2020-12-09 (×4): qty 1

## 2020-12-09 MED ORDER — ENOXAPARIN SODIUM 40 MG/0.4ML IJ SOSY
40.0000 mg | PREFILLED_SYRINGE | INTRAMUSCULAR | Status: DC
  Administered 2020-12-09: 22:00:00 40 mg via SUBCUTANEOUS
  Filled 2020-12-09: qty 0.4

## 2020-12-09 MED ORDER — INSULIN ASPART 100 UNIT/ML IJ SOLN
0.0000 [IU] | Freq: Three times a day (TID) | INTRAMUSCULAR | Status: DC
  Administered 2020-12-09: 5 [IU] via SUBCUTANEOUS
  Filled 2020-12-09: qty 1

## 2020-12-09 MED ORDER — POTASSIUM CHLORIDE CRYS ER 20 MEQ PO TBCR
40.0000 meq | EXTENDED_RELEASE_TABLET | Freq: Once | ORAL | Status: AC
  Administered 2020-12-09: 40 meq via ORAL
  Filled 2020-12-09: qty 2

## 2020-12-09 MED ORDER — INSULIN DETEMIR 100 UNIT/ML ~~LOC~~ SOLN
8.0000 [IU] | Freq: Two times a day (BID) | SUBCUTANEOUS | Status: DC
  Filled 2020-12-09: qty 0.08

## 2020-12-09 MED ORDER — LACTATED RINGERS IV SOLN: INTRAVENOUS | Status: AC

## 2020-12-09 MED ORDER — INSULIN GLARGINE-YFGN 100 UNIT/ML ~~LOC~~ SOLN
5.0000 [IU] | Freq: Every day | SUBCUTANEOUS | Status: DC
  Administered 2020-12-09: 5 [IU] via SUBCUTANEOUS
  Filled 2020-12-09: qty 0.05

## 2020-12-09 MED ORDER — INSULIN ASPART PROT & ASPART (70-30 MIX) 100 UNIT/ML ~~LOC~~ SUSP
12.0000 [IU] | Freq: Two times a day (BID) | SUBCUTANEOUS | Status: DC
  Administered 2020-12-09 – 2020-12-10 (×2): 12 [IU] via SUBCUTANEOUS
  Filled 2020-12-09 (×2): qty 10

## 2020-12-09 MED ORDER — INSULIN ASPART 100 UNIT/ML IJ SOLN
3.0000 [IU] | Freq: Three times a day (TID) | INTRAMUSCULAR | Status: DC
  Administered 2020-12-09: 3 [IU] via SUBCUTANEOUS
  Filled 2020-12-09: qty 1

## 2020-12-09 NOTE — Progress Notes (Signed)
Notified Amy Cox, DO , of Endotool recommedation to contact provider for transition orders and Anion Gap now 9, CBG 141.  D5LR @ 125 cc/hr infusing and insulin now at 1.3 units/hr per endotool. Awaiting new orders.

## 2020-12-09 NOTE — Progress Notes (Addendum)
PROGRESS NOTE    Cheyenne Davenport  [id number] DOB: 1990/03/01 DOA: 12/08/2020 PCP: Rutherford Limerick, PA  104A/104A-AA   Assessment & Plan:   Active Problems:   DKA, type 1 ([place])   Cheyenne Davenport is a 30 y.o. female with medical history significant for type 1 diabetes, essential hypertension, PCOS on metformin, hyperlipidemia, chronic anxiety/depression, obesity, who presented to [facility] ED with complaints of nausea, vomiting, abdominal pain, and watery stools of 3 days duration.  No subjective fevers but admits to chills.  Has been unable to keep anything down for the past 2 days including her home medications.  Her abdominal pain has been worsening for the past 24 hours.  It is diffuse, sharp and constant.  Associated with 3 days of watery stools with increase frequency.  At the time of this visit she has had 3 watery stools since this morning.  Upon presentation to the ED, she was in severe pain.  Work-up revealed DKA type I, mild colitis, and mild right hydronephrosis with no obstructing lesion identified such as a stone.  She was started on DKA protocol with IV fluid hydration in the ED.   DKA  type I DM Insulin non-compliance Contributed by colitis and insulin non-compliance.   --Home regimen is Levemir 13u BID and mealtime 6u TID.  Pt reported she takes Levemir at home but not mealtime coverage.   --on presentation, Serum bicarb 18, anion gap 18.  Serum glucose 430.  Beta hydroxybutyric acid 4.61 --off of insulin gtt this morning and transitioned to subQ insulin Plan: --cont IVF for now --diabetic coordinator for education --since pt can only reliably take insulin BID, switch regimen to 70/30 combo 12u BID   Newly diagnosed mild colitis N/V/D and abdominal pain Lipase normal, 32. Endorses watery stools for the past 3 days. Afebrile, non septic appearing --stool studies were not obtained since pt had no diarrhea since presentation. Plan: --cont IVF for now --hold off abx for  now   Newly diagnosed mild right hydronephrosis seen on CT scan CT scan shows mild right hydronephrosis with decompressed ureter and no obstructing lesions identified such as a stone. Monitor urine output   CKD 2 She appears to be at her baseline creatinine 1.3    Chronic anxiety/depression --cont home Buspar and Zoloft   Obesity BMI 36 Recommend weight loss outpatient with regular physical activity and healthy dieting.   PCOS on metformin She follows with gynecology outpatient Patient has 1 child Urine pregnancy negative. --cont home metformin   Pseudohyponatremia Corrected sodium 133 for hyperglycemia 430, is 138  Hypokalemia --monitor and replete PRN   DVT prophylaxis: Lovenox SQ Code Status: Full code  Family Communication:  Level of care: Med-Surg Dispo:   The patient is from: home Anticipated d/c is to: home Anticipated d/c date is: 1-2 days Patient currently is not medically ready to d/c due to: on IVF, need insulin titration   Subjective and Interval History:  Pt was off insulin gtt this morning and transitioned to subQ insulin.  Pt continued to have nausea and abdominal pain with eating, so pt kept on IVF.  No more diarrhea since presentation.   Objective: Vitals:   12/09/20 1200 12/09/20 1324 12/09/20 1400 12/09/20 1517  BP:  (!) 191/85 (!) 143/92 (!) 150/96  Pulse:   91 95  Resp: 15   16  Temp:  (!) 97.2 F (36.2 C)  99.7 F (37.6 C)  TempSrc:  Oral    SpO2: 100%   100%  Weight:      Height:        Intake/Output Summary (Last 24 hours) at 12/09/2020 1821 Last data filed at 12/09/2020 1351 Gross per 24 hour  Intake 4163.51 ml  Output --  Net 4163.51 ml   Filed Weights   12/08/20 1311 12/08/20 1800  Weight: 97.5 kg 93.3 kg    Examination:   Constitutional: NAD, AAOx3 HEENT: conjunctivae and lids normal, EOMI CV: No cyanosis.   RESP: normal respiratory effort, on RA Extremities: No effusions, edema in BLE SKIN: warm, dry Neuro:  II - XII grossly intact.   Psych: Normal mood and affect.  Appropriate judgement and reason   Data Reviewed: I have personally reviewed following labs and imaging studies  CBC: Recent Labs  Lab 12/08/20 1316 12/08/20 1631 12/09/20 0049  WBC 12.2* 12.5* 12.0*  HGB 14.6 13.4 11.9*  HCT 43.1 39.6 35.1*  MCV 86.0 86.8 86.0  PLT 338 309 [id number]   Basic Metabolic Panel: Recent Labs  Lab 12/08/20 1406 12/08/20 1631 12/08/20 2206 12/09/20 0049 12/09/20 0247 12/09/20 0910  NA  --  135 136 132* 134* 135  K  --  3.3* 3.3* 3.3* 3.2* 4.1  CL  --  102 102 103 104 101  CO2  --  16* 17* 20* 22 20*  GLUCOSE  --  239* 210* 151* 159* 272*  BUN  --  18 19 20 19 19   CREATININE  --  1.17*  1.17* 1.23* 1.18* 1.31* 1.36*  CALCIUM  --  8.9 8.8* 8.3* 8.3* 8.8*  MG 1.8  --   --  1.6*  --   --   PHOS  --   --   --  3.4  --   --    GFR: Estimated Creatinine Clearance: 65.7 mL/min (A) (by C-G formula based on SCr of 1.36 mg/dL (H)). Liver Function Tests: Recent Labs  Lab 12/08/20 1316 12/09/20 0049  AST 17 13*  ALT 16 11  ALKPHOS 66 48  BILITOT 1.3* 1.0  PROT 8.4* 6.7  ALBUMIN 3.6 2.9*   Recent Labs  Lab 12/08/20 1316  LIPASE 32   No results for input(s): AMMONIA in the last 168 hours. Coagulation Profile: No results for input(s): INR, PROTIME in the last 168 hours. Cardiac Enzymes: No results for input(s): CKTOTAL, CKMB, CKMBINDEX, TROPONINI in the last 168 hours. BNP (last 3 results) No results for input(s): PROBNP in the last 8760 hours. HbA1C: Recent Labs    12/08/20 1631  HGBA1C 10.9*   CBG: Recent Labs  Lab 12/09/20 0310 12/09/20 0518 12/09/20 0737 12/09/20 1117 12/09/20 1640  GLUCAP 141* 177* 217* 277* 153*   Lipid Profile: No results for input(s): CHOL, HDL, LDLCALC, TRIG, CHOLHDL, LDLDIRECT in the last 72 hours. Thyroid Function Tests: No results for input(s): TSH, T4TOTAL, FREET4, T3FREE, THYROIDAB in the last 72 hours. Anemia Panel: No results for  input(s): VITAMINB12, FOLATE, FERRITIN, TIBC, IRON, RETICCTPCT in the last 72 hours. Sepsis Labs: No results for input(s): PROCALCITON, LATICACIDVEN in the last 168 hours.  Recent Results (from the past 240 hour(s))  Resp Panel by RT-PCR (Flu A&B, Covid) Nasopharyngeal Swab     Status: None   Collection Time: 12/08/20  2:06 PM   Specimen: Nasopharyngeal Swab; Nasopharyngeal(NP) swabs in vial transport medium  Result Value Ref Range Status   SARS Coronavirus 2 by RT PCR NEGATIVE NEGATIVE Final    Comment: (NOTE) SARS-CoV-2 target nucleic acids are NOT DETECTED.  The SARS-CoV-2 RNA is generally detectable  in upper respiratory specimens during the acute phase of infection. The lowest concentration of SARS-CoV-2 viral copies this assay can detect is 138 copies/mL. A negative result does not preclude SARS-Cov-2 infection and should not be used as the sole basis for treatment or other patient management decisions. A negative result may occur with  improper specimen collection/handling, submission of specimen other than nasopharyngeal swab, presence of viral mutation(s) within the areas targeted by this assay, and inadequate number of viral copies(<138 copies/mL). A negative result must be combined with clinical observations, patient history, and epidemiological information. The expected result is Negative.  Fact Sheet for Patients:  BloggerCourse.com  Fact Sheet for Healthcare Providers:  SeriousBroker.it  This test is no t yet approved or cleared by the [place] FDA and  has been authorized for detection and/or diagnosis of SARS-CoV-2 by FDA under an Emergency Use Authorization (EUA). This EUA will remain  in effect (meaning this test can be used) for the duration of the COVID-19 declaration under Section 564(b)(1) of the Act, 21 U.S.C.section 360bbb-3(b)(1), unless the authorization is terminated  or revoked sooner.        Influenza A by PCR NEGATIVE NEGATIVE Final   Influenza B by PCR NEGATIVE NEGATIVE Final    Comment: (NOTE) The Xpert Xpress SARS-CoV-2/FLU/RSV plus assay is intended as an aid in the diagnosis of influenza from Nasopharyngeal swab specimens and should not be used as a sole basis for treatment. Nasal washings and aspirates are unacceptable for Xpert Xpress SARS-CoV-2/FLU/RSV testing.  Fact Sheet for Patients: BloggerCourse.com  Fact Sheet for Healthcare Providers: SeriousBroker.it  This test is not yet approved or cleared by the [place] FDA and has been authorized for detection and/or diagnosis of SARS-CoV-2 by FDA under an Emergency Use Authorization (EUA). This EUA will remain in effect (meaning this test can be used) for the duration of the COVID-19 declaration under Section 564(b)(1) of the Act, 21 U.S.C. section 360bbb-3(b)(1), unless the authorization is terminated or revoked.  Performed at [facility], [address] [place], [place] [address]   MRSA Next Gen by PCR, Nasal     Status: Abnormal   Collection Time: 12/08/20  5:43 PM   Specimen: Nasal Mucosa; Nasal Swab  Result Value Ref Range Status   MRSA by PCR Next Gen DETECTED (A) NOT DETECTED Final    Comment: RESULT CALLED TO, READ BACK BY AND VERIFIED WITH: MEGAN FURR 12/08/20 1912 MU (NOTE) The GeneXpert MRSA Assay (FDA approved for NASAL specimens only), is one component of a comprehensive MRSA colonization surveillance program. It is not intended to diagnose MRSA infection nor to guide or monitor treatment for MRSA infections. Test performance is not FDA approved in patients less than 80 years old. Performed at [facility], [address] [place], [place] [address]       Radiology Studies: CT Abdomen Pelvis W Contrast  Result Date: 12/08/2020 CLINICAL DATA:  Abdominal pain, acute, nonlocalized EXAM: CT ABDOMEN AND PELVIS WITH  CONTRAST TECHNIQUE: Multidetector CT imaging of the abdomen and pelvis was performed using the standard protocol following bolus administration of intravenous contrast. CONTRAST:  OMNIPAQUE IOHEXOL 300 MG/ML  SOLN COMPARISON:  None. FINDINGS: Lower chest: No acute abnormality. Hepatobiliary: No focal liver abnormality is seen. No gallstones, gallbladder wall thickening, or biliary dilatation. Pancreas: Unremarkable. No pancreatic ductal dilatation or surrounding inflammatory changes. Spleen: Normal in size without focal abnormality. Adrenals/Urinary Tract: Adrenal glands are unremarkable. There is mild right pelvicaliectasis with decompressed ureter and no  evidence of obstructing stone. Bladder is unremarkable. Stomach/Bowel: The stomach is within normal limits. There is no evidence of bowel obstruction. The appendix is normal. There is diffuse submucosal thickening of the colon including the rectum, with relative sparing of the sigmoid colon. Vascular/Lymphatic: No significant vascular findings are present. No enlarged abdominal or pelvic lymph nodes. Reproductive: Unremarkable. Other: No abdominal wall hernia or abnormality. No abdominopelvic ascites. Musculoskeletal: No acute osseous abnormality. No suspicious lytic or blastic lesions. Mild degenerative disc height loss at L5-S1. IMPRESSION: Mild diffuse colonic wall thickening including the rectum, with relative sparing of the sigmoid colon. This is consistent with mild colitis. No evidence of bowel obstruction.  Normal appendix. Mild right hydronephrosis with decompressed ureter and no obstructing lesion identified such as a stone. 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 This could be further evaluated with a Mag-3 nuclear medicine renal study and/or CT urogram. Electronically Signed   By: Maurine Simmering M.D.   On: 12/08/2020 15:45     Scheduled Meds:  busPIRone  10 mg Oral BID   carvedilol  25 mg Oral BID WC   Chlorhexidine  Gluconate Cloth  6 each Topical Q0600   Chlorhexidine Gluconate Cloth  6 each Topical Q0600   hydrALAZINE  10 mg Oral TID   insulin aspart  0-9 Units Subcutaneous TID WC   insulin aspart protamine- aspart  12 Units Subcutaneous BID WC   [START ON 12/10/2020] metFORMIN  1,000 mg Oral BID   mupirocin ointment  1 application Nasal BID   NIFEdipine  90 mg Oral Daily   sertraline  100 mg Oral Daily   Continuous Infusions:  lactated ringers 100 mL/hr at 12/09/20 1722   promethazine (PHENERGAN) injection (IM or IVPB) Stopped (12/09/20 1257)     LOS: 1 day     Enzo Bi, MD [facility] If 7PM-7AM, please contact night-coverage 12/09/2020, 6:21 PM

## 2020-12-09 NOTE — Progress Notes (Addendum)
Inpatient Diabetes Program Recommendations  AACE/ADA: New Consensus Statement on Inpatient Glycemic Control (2015)  Target Ranges:  Prepandial:   less than 140 mg/dL      Peak postprandial:   less than 180 mg/dL (1-2 hours)      Critically ill patients:  140 - 180 mg/dL   Lab Results  Component Value Date   GLUCAP 217 (H) 12/09/2020   HGBA1C 10.9 (H) 12/08/2020    Review of Glycemic Control Results for Cheyenne Davenport, Cheyenne Davenport (MRN [id number]) as of 12/09/2020 09:23  Ref. Range 12/09/2020 01:58 12/09/2020 03:10 12/09/2020 05:18 12/09/2020 07:37  Glucose-Capillary Latest Ref Range: 70 - 99 mg/dL 734 (H) 287 (H) 681 (H) 217 (H)   Diabetes history: DM 1 Outpatient Diabetes medications:  Novolog 6 units tid with meals, Levemir 13 units bid, Metformin XR 500  mg bid Current orders for Inpatient glycemic control:  Semglee 5 units daily, Novolog moderate tid with meals  Inpatient Diabetes Program Recommendations:    Please consider d/c of Semglee and add Levemir 8 units bid.  Also please reduce Novolog correction to sensitive and add Novolog meal coverage 3 units tid with meals (hold if patient eats less than 50% or NPO).   Thanks,  Beryl Meager, RN, BC-ADM Inpatient Diabetes Coordinator Pager [phone]  (8a-5p)  Addendum [phone]-  Spoke with patient regarding DM and insulin.  Discussed with her that MD wants to try 70/30 twice a day to see if this improves compliance with taking medications.  Per MD, patient was only taking her Levemir prior to admit and not the rapid acting.  Patient has a Dexcom glucose sensor at home that had to be removed for CT scan.  She states that she does have an immense fear of going low.  We discussed that sensor gives her important information that will help her know if her blood sugar is dropping.  Discussed that with the 70/30, she will not have as much flexibility and will need to eat and take insulin at the same time every day.  She has been working with  endocrinologist on carb counting and wants to eventually get insulin pump.  Discussed importance of glycemic control and taking care of herself.  Patient appreciative of visit and plans to f/u with her MD at [facility] as well as the DM educator.

## 2020-12-09 NOTE — Plan of Care (Signed)
  Problem: Metabolic: Goal: Ability to maintain appropriate glucose levels will improve Outcome: Progressing   

## 2020-12-10 ENCOUNTER — Other Ambulatory Visit: Payer: Self-pay

## 2020-12-10 ENCOUNTER — Encounter: Payer: Self-pay | Admitting: Internal Medicine

## 2020-12-10 LAB — CBC
HCT: 36.4 % (ref 36.0–46.0)
Hemoglobin: 12.4 g/dL (ref 12.0–15.0)
MCH: 29.7 pg (ref 26.0–34.0)
MCHC: 34.1 g/dL (ref 30.0–36.0)
MCV: 87.3 fL (ref 80.0–100.0)
Platelets: 293 10*3/uL (ref 150–400)
RBC: 4.17 MIL/uL (ref 3.87–5.11)
RDW: 13.4 % (ref 11.5–15.5)
WBC: 7.2 10*3/uL (ref 4.0–10.5)
nRBC: 0 % (ref 0.0–0.2)

## 2020-12-10 LAB — BASIC METABOLIC PANEL
Anion gap: 12 (ref 5–15)
BUN: 10 mg/dL (ref 6–20)
CO2: 21 mmol/L — ABNORMAL LOW (ref 22–32)
Calcium: 8.6 mg/dL — ABNORMAL LOW (ref 8.9–10.3)
Chloride: 103 mmol/L (ref 98–111)
Creatinine, Ser: 0.88 mg/dL (ref 0.44–1.00)
GFR, Estimated: >60 mL/min (ref >60)
Glucose, Bld: 243 mg/dL — ABNORMAL HIGH (ref 70–99)
Potassium: 3.5 mmol/L (ref 3.5–5.1)
Sodium: 136 mmol/L (ref 135–145)

## 2020-12-10 LAB — GLUCOSE, CAPILLARY
Glucose-Capillary: 234 mg/dL — ABNORMAL HIGH (ref 70–99)
Glucose-Capillary: 240 mg/dL — ABNORMAL HIGH (ref 70–99)

## 2020-12-10 LAB — MAGNESIUM: Magnesium: 1.7 mg/dL (ref 1.7–2.4)

## 2020-12-10 MED ORDER — INSULIN ASPART PROT & ASPART (70-30 MIX) 100 UNIT/ML ~~LOC~~ SUSP: 16.0000 [IU] | Freq: Two times a day (BID) | SUBCUTANEOUS | 0 refills | Status: AC

## 2020-12-10 NOTE — Discharge Summary (Addendum)
Physician Discharge Summary   Cheyenne Davenport  female DOB: [birth year]  YBO:[id number]  PCP: Gildardo Pounds, PA  Admit date: 12/08/2020 Discharge date: 12/10/2020  Admitted From: home Disposition:  home CODE STATUS: Full code  Discharge Instructions     Discharge instructions   Complete by: As directed    You have been treated for DKA during this hospitalization.  Your A1c is 10.9, not well controlled.  Since you are having trouble taking your short-acting insulin, we are switching you to 70/30 combo insulin 16 units twice a day to be taken with breakfast and dinner, which will give both basal and mealtime coverage.   Dr. Darlin Priestly [facility] Course:  For full details, please see H&P, progress notes, consult notes and ancillary notes.  Briefly,  Cheyenne Davenport is a 30 y.o. female with medical history significant for type 1 diabetes, essential hypertension, PCOS on metformin, chronic anxiety/depression, obesity, who presented to [facility] ED with complaints of nausea, vomiting, abdominal pain, and watery stools of 3 days duration.  No subjective fevers but admits to chills.  Has been unable to keep anything down for the past 2 days including her home medications.  Her abdominal pain has been worsening for the past 24 hours.  It is diffuse, sharp and constant.  Associated with 3 days of watery stools with increase frequency.    Work-up revealed DKA type I, mild colitis, and mild right hydronephrosis with no obstructing lesion identified such as a stone.  She was started on DKA protocol with IV fluid hydration in the ED.   DKA, POA, resolved type I DM Insulin non-compliance DKA contributed by colitis and insulin non-compliance.   --Home regimen is Levemir 13u BID and mealtime 6u TID.  Pt reported she takes Levemir at home but not mealtime coverage.   --on presentation, Serum bicarb 18, anion gap 18.  Serum glucose 430.  Beta hydroxybutyric acid 4.61 --diabetic  coordinator consulted for patient education --since pt can only reliably take insulin BID, switch regimen to 70/30 combo 16u BID at discharge.   --Pt will follow up with outpatient provider for further insulin adjustment.   mild colitis N/V/D and abdominal pain Lipase normal, 32. Endorses watery stools for the past 3 days. Afebrile, non septic appearing.  Abx was not started. --stool studies were not obtained since pt had no diarrhea since presentation. --For the first 2 days after presentation, pt was not tolerating regular diet due to nausea and abdominal pain with oral intake, so was given gentle IVF hydration.  Symptoms however resolved on the day of discharge, and pt tolerated regular diet.   mild right hydronephrosis seen on CT scan CT scan shows mild right hydronephrosis with decompressed ureter and no obstructing lesions identified such as a stone.   CKD 2 She appears to be at her baseline creatinine 1.3    Chronic anxiety/depression --cont home Buspar and Zoloft   Obesity BMI 36 Recommend weight loss outpatient with regular physical activity and healthy dieting.   PCOS on metformin She follows with gynecology outpatient Patient has 1 child Urine pregnancy negative. --cont home metformin   Pseudohyponatremia Corrected sodium 133 for hyperglycemia 430, was 138   Hypokalemia --monitor and replete PRN    Discharge Diagnoses:  Active Problems:   DKA, type 1 (HCC)   30 Day Unplanned Readmission Risk Score    Flowsheet Row ED to Hosp-Admission (Current) from 12/08/2020 in [facility] (  1C)  30 Day Unplanned Readmission Risk Score (%) 14.24 Filed at 12/10/2020 1200       This score is the patient's risk of an unplanned readmission within 30 days of being discharged (0 -100%). The score is based on dignosis, age, lab data, medications, orders, and past utilization.   Low:  0-14.9   Medium: 15-21.9   High: 22-29.9   Extreme: 30 and  above         Discharge Instructions:  Allergies as of 12/10/2020       Reactions   Shellfish-derived Products Hives   Lisinopril Nausea Only, Nausea And Vomiting        Medication List     STOP taking these medications    ARIPiprazole 5 MG tablet Commonly known as: ABILIFY   DULoxetine 60 MG capsule Commonly known as: CYMBALTA   HYDROcodone-acetaminophen 5-325 MG tablet Commonly known as: NORCO/VICODIN   insulin aspart 100 UNIT/ML injection Commonly known as: novoLOG   Insulin Degludec 100 UNIT/ML Soln   insulin detemir 100 unit/ml Soln Commonly known as: LEVEMIR       TAKE these medications    busPIRone 10 MG tablet Commonly known as: BUSPAR Take 10 mg by mouth 2 (two) times daily.   carvedilol 25 MG tablet Commonly known as: COREG Take 25 mg by mouth 2 (two) times daily with a meal.   hydrALAZINE 10 MG tablet Commonly known as: APRESOLINE Take 10 mg by mouth 3 (three) times daily.   insulin aspart protamine- aspart (70-30) 100 UNIT/ML injection Commonly known as: NOVOLOG MIX 70/30 Inject 0.16 mLs (16 Units total) into the skin 2 (two) times daily with a meal. Breakfast and dinner.   metFORMIN 500 MG 24 hr tablet Commonly known as: GLUCOPHAGE-XR Take 1,000 mg by mouth 2 (two) times daily.   NIFEdipine 90 MG 24 hr tablet Commonly known as: PROCARDIA XL/NIFEDICAL-XL Take 90 mg by mouth daily.   sertraline 50 MG tablet Commonly known as: ZOLOFT Take 100 mg by mouth daily.   traZODone 50 MG tablet Commonly known as: DESYREL Take 50 mg by mouth at bedtime as needed for sleep.         Follow-up Information     August Luz A, PA Follow up in 1 week(s).   Specialty: Physician Assistant Contact information: [place] [address] [phone]                 Allergies  Allergen Reactions   Shellfish-Derived Products Hives   Lisinopril Nausea Only and Nausea And Vomiting     The results of significant  diagnostics from this hospitalization (including imaging, microbiology, ancillary and laboratory) are listed below for reference.   Consultations:   Procedures/Studies: CT Abdomen Pelvis W Contrast  Result Date: 12/08/2020 CLINICAL DATA:  Abdominal pain, acute, nonlocalized EXAM: CT ABDOMEN AND PELVIS WITH CONTRAST TECHNIQUE: Multidetector CT imaging of the abdomen and pelvis was performed using the standard protocol following bolus administration of intravenous contrast. CONTRAST:  111mL OMNIPAQUE IOHEXOL 300 MG/ML  SOLN COMPARISON:  None. FINDINGS: Lower chest: No acute abnormality. Hepatobiliary: No focal liver abnormality is seen. No gallstones, gallbladder wall thickening, or biliary dilatation. Pancreas: Unremarkable. No pancreatic ductal dilatation or surrounding inflammatory changes. Spleen: Normal in size without focal abnormality. Adrenals/Urinary Tract: Adrenal glands are unremarkable. There is mild right pelvicaliectasis with decompressed ureter and no evidence of obstructing stone. Bladder is unremarkable. Stomach/Bowel: The stomach is within normal limits. There is no evidence of bowel obstruction. The  appendix is normal. There is diffuse submucosal thickening of the colon including the rectum, with relative sparing of the sigmoid colon. Vascular/Lymphatic: No significant vascular findings are present. No enlarged abdominal or pelvic lymph nodes. Reproductive: Unremarkable. Other: No abdominal wall hernia or abnormality. No abdominopelvic ascites. Musculoskeletal: No acute osseous abnormality. No suspicious lytic or blastic lesions. Mild degenerative disc height loss at L5-S1. IMPRESSION: Mild diffuse colonic wall thickening including the rectum, with relative sparing of the sigmoid colon. This is consistent with mild colitis. No evidence of bowel obstruction.  Normal appendix. Mild right hydronephrosis with decompressed ureter and no obstructing lesion identified such as a stone. 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 This could be further evaluated with a Mag-3 nuclear medicine renal study and/or CT urogram. Electronically Signed   By: Maurine Simmering M.D.   On: 12/08/2020 15:45      Labs: BNP (last 3 results) No results for input(s): BNP in the last 8760 hours. Basic Metabolic Panel: Recent Labs  Lab 12/08/20 1406 12/08/20 1631 12/08/20 2206 12/09/20 0049 12/09/20 0247 12/09/20 0910 12/10/20 0649  NA  --    < > 136 132* 134* 135 136  K  --    < > 3.3* 3.3* 3.2* 4.1 3.5  CL  --    < > 102 103 104 101 103  CO2  --    < > 17* 20* 22 20* 21*  GLUCOSE  --    < > 210* 151* 159* 272* 243*  BUN  --    < > 19 20 19 19 10   CREATININE  --    < > 1.23* 1.18* 1.31* 1.36* 0.88  CALCIUM  --    < > 8.8* 8.3* 8.3* 8.8* 8.6*  MG 1.8  --   --  1.6*  --   --  1.7  PHOS  --   --   --  3.4  --   --   --    < > = values in this interval not displayed.   Liver Function Tests: Recent Labs  Lab 12/08/20 1316 12/09/20 0049  AST 17 13*  ALT 16 11  ALKPHOS 66 48  BILITOT 1.3* 1.0  PROT 8.4* 6.7  ALBUMIN 3.6 2.9*   Recent Labs  Lab 12/08/20 1316  LIPASE 32   No results for input(s): AMMONIA in the last 168 hours. CBC: Recent Labs  Lab 12/08/20 1316 12/08/20 1631 12/09/20 0049 12/10/20 0649  WBC 12.2* 12.5* 12.0* 7.2  HGB 14.6 13.4 11.9* 12.4  HCT 43.1 39.6 35.1* 36.4  MCV 86.0 86.8 86.0 87.3  PLT 338 309 320 293   Cardiac Enzymes: No results for input(s): CKTOTAL, CKMB, CKMBINDEX, TROPONINI in the last 168 hours. BNP: Invalid input(s): POCBNP CBG: Recent Labs  Lab 12/09/20 1117 12/09/20 1640 12/09/20 1931 12/10/20 0709 12/10/20 1154  GLUCAP 277* 153* 234* 234* 240*   D-Dimer No results for input(s): DDIMER in the last 72 hours. Hgb A1c Recent Labs    12/08/20 1631  HGBA1C 10.9*   Lipid Profile No results for input(s): CHOL, HDL, LDLCALC, TRIG, CHOLHDL, LDLDIRECT in the last 72 hours. Thyroid function studies No results for  input(s): TSH, T4TOTAL, T3FREE, THYROIDAB in the last 72 hours.  Invalid input(s): FREET3 Anemia work up No results for input(s): VITAMINB12, FOLATE, FERRITIN, TIBC, IRON, RETICCTPCT in the last 72 hours. Urinalysis    Component Value Date/Time   COLORURINE YELLOW (A) 12/08/2020 1316   APPEARANCEUR CLEAR (A) 12/08/2020 1316  LABSPEC 1.025 12/08/2020 1316   PHURINE 5.0 12/08/2020 1316   GLUCOSEU >=500 (A) 12/08/2020 1316   HGBUR MODERATE (A) 12/08/2020 1316   BILIRUBINUR NEGATIVE 12/08/2020 1316   KETONESUR 20 (A) 12/08/2020 1316   PROTEINUR 100 (A) 12/08/2020 1316   NITRITE NEGATIVE 12/08/2020 1316   LEUKOCYTESUR NEGATIVE 12/08/2020 1316   Sepsis Labs Invalid input(s): PROCALCITONIN,  WBC,  LACTICIDVEN Microbiology Recent Results (from the past 240 hour(s))  Resp Panel by RT-PCR (Flu A&B, Covid) Nasopharyngeal Swab     Status: None   Collection Time: 12/08/20  2:06 PM   Specimen: Nasopharyngeal Swab; Nasopharyngeal(NP) swabs in vial transport medium  Result Value Ref Range Status   SARS Coronavirus 2 by RT PCR NEGATIVE NEGATIVE Final    Comment: (NOTE) SARS-CoV-2 target nucleic acids are NOT DETECTED.  The SARS-CoV-2 RNA is generally detectable in upper respiratory specimens during the acute phase of infection. The lowest concentration of SARS-CoV-2 viral copies this assay can detect is 138 copies/mL. A negative result does not preclude SARS-Cov-2 infection and should not be used as the sole basis for treatment or other patient management decisions. A negative result may occur with  improper specimen collection/handling, submission of specimen other than nasopharyngeal swab, presence of viral mutation(s) within the areas targeted by this assay, and inadequate number of viral copies(<138 copies/mL). A negative result must be combined with clinical observations, patient history, and epidemiological information. The expected result is Negative.  Fact Sheet for Patients:   EntrepreneurPulse.com.au  Fact Sheet for Healthcare Providers:  IncredibleEmployment.be  This test is no t yet approved or cleared by the [place] FDA and  has been authorized for detection and/or diagnosis of SARS-CoV-2 by FDA under an Emergency Use Authorization (EUA). This EUA will remain  in effect (meaning this test can be used) for the duration of the COVID-19 declaration under Section 564(b)(1) of the Act, 21 U.S.C.section 360bbb-3(b)(1), unless the authorization is terminated  or revoked sooner.       Influenza A by PCR NEGATIVE NEGATIVE Final   Influenza B by PCR NEGATIVE NEGATIVE Final    Comment: (NOTE) The Xpert Xpress SARS-CoV-2/FLU/RSV plus assay is intended as an aid in the diagnosis of influenza from Nasopharyngeal swab specimens and should not be used as a sole basis for treatment. Nasal washings and aspirates are unacceptable for Xpert Xpress SARS-CoV-2/FLU/RSV testing.  Fact Sheet for Patients: EntrepreneurPulse.com.au  Fact Sheet for Healthcare Providers: IncredibleEmployment.be  This test is not yet approved or cleared by the [place] FDA and has been authorized for detection and/or diagnosis of SARS-CoV-2 by FDA under an Emergency Use Authorization (EUA). This EUA will remain in effect (meaning this test can be used) for the duration of the COVID-19 declaration under Section 564(b)(1) of the Act, 21 U.S.C. section 360bbb-3(b)(1), unless the authorization is terminated or revoked.  Performed at [facility], [address] [place], [place] [address]   MRSA Next Gen by PCR, Nasal     Status: Abnormal   Collection Time: 12/08/20  5:43 PM   Specimen: Nasal Mucosa; Nasal Swab  Result Value Ref Range Status   MRSA by PCR Next Gen DETECTED (A) NOT DETECTED Final    Comment: RESULT CALLED TO, READ BACK BY AND VERIFIED WITH: MEGAN FURR 12/08/20 1912 MU (NOTE) The  GeneXpert MRSA Assay (FDA approved for NASAL specimens only), is one component of a comprehensive MRSA colonization surveillance program. It is not intended to diagnose MRSA infection nor to guide or monitor treatment  for MRSA infections. Test performance is not FDA approved in patients less than 38 years old. Performed at [facility], [address] [place], [place] [address]      Total time spend on discharging this patient, including the last patient exam, discussing the hospital stay, instructions for ongoing care as it relates to all pertinent caregivers, as well as preparing the medical discharge records, prescriptions, and/or referrals as applicable, is 35 minutes.    Enzo Bi, MD  [facility] 12/10/2020, 12:51 PM

## 2020-12-10 NOTE — Progress Notes (Signed)
Inpatient Diabetes Program Recommendations  AACE/ADA: New Consensus Statement on Inpatient Glycemic Control (2015)  Target Ranges:  Prepandial:   less than 140 mg/dL      Peak postprandial:   less than 180 mg/dL (1-2 hours)      Critically ill patients:  140 - 180 mg/dL   Lab Results  Component Value Date   GLUCAP 240 (H) 12/10/2020   HGBA1C 10.9 (H) 12/08/2020    Review of Glycemic Control Results for Cheyenne Davenport, Cheyenne Davenport (MRN [id number]) as of 12/10/2020 13:39  Ref. Range 12/09/2020 11:17 12/09/2020 16:40 12/09/2020 19:31 12/10/2020 07:09 12/10/2020 11:54  Glucose-Capillary Latest Ref Range: 70 - 99 mg/dL 357 (H) 017 (H) 793 (H) 234 (H) 240 (H)  Diabetes history: DM 1 Outpatient Diabetes medications:  Novolog 6 units tid with meals, Levemir 13 units bid, Metformin XR 500  mg bid Current orders for Inpatient glycemic control:  Novolog 70/30- 12 units bid Metformin XR 1000 mg bid Novolog sensitive tid with meals Inpatient Diabetes Program Recommendations:    May consider increasing Novolog 70/30 to 16 units bid.    Thanks,  Beryl Meager, RN, BC-ADM Inpatient Diabetes Coordinator Pager [phone]  (8a-5p)

## 2020-12-22 ENCOUNTER — Inpatient Hospital Stay
Admission: EM | Admit: 2020-12-22 | Discharge: 2020-12-23 | DRG: 392 | Disposition: A | Discharge status: Home / Self Care | Payer: BLUE CROSS/BLUE SHIELD | Attending: Internal Medicine | Admitting: Internal Medicine

## 2020-12-22 ENCOUNTER — Other Ambulatory Visit: Payer: Self-pay

## 2020-12-22 DIAGNOSIS — Z7984 Long term (current) use of oral hypoglycemic drugs: Secondary | ICD-10-CM | POA: Diagnosis not present

## 2020-12-22 DIAGNOSIS — E669 Obesity, unspecified: Secondary | ICD-10-CM | POA: Diagnosis present

## 2020-12-22 DIAGNOSIS — F12188 Cannabis abuse with other cannabis-induced disorder: Secondary | ICD-10-CM | POA: Diagnosis present

## 2020-12-22 DIAGNOSIS — R112 Nausea with vomiting, unspecified: Secondary | ICD-10-CM | POA: Diagnosis present

## 2020-12-22 DIAGNOSIS — E1065 Type 1 diabetes mellitus with hyperglycemia: Secondary | ICD-10-CM | POA: Diagnosis present

## 2020-12-22 DIAGNOSIS — Z91013 Allergy to seafood: Secondary | ICD-10-CM | POA: Diagnosis not present

## 2020-12-22 DIAGNOSIS — Z79899 Other long term (current) drug therapy: Secondary | ICD-10-CM

## 2020-12-22 DIAGNOSIS — Z20822 Contact with and (suspected) exposure to covid-19: Secondary | ICD-10-CM | POA: Diagnosis present

## 2020-12-22 DIAGNOSIS — Z794 Long term (current) use of insulin: Secondary | ICD-10-CM | POA: Diagnosis not present

## 2020-12-22 DIAGNOSIS — R739 Hyperglycemia, unspecified: Secondary | ICD-10-CM

## 2020-12-22 DIAGNOSIS — Z6836 Body mass index (BMI) 36.0-36.9, adult: Secondary | ICD-10-CM

## 2020-12-22 DIAGNOSIS — E1043 Type 1 diabetes mellitus with diabetic autonomic (poly)neuropathy: Secondary | ICD-10-CM | POA: Diagnosis present

## 2020-12-22 DIAGNOSIS — E282 Polycystic ovarian syndrome: Secondary | ICD-10-CM | POA: Diagnosis present

## 2020-12-22 DIAGNOSIS — F32A Depression, unspecified: Secondary | ICD-10-CM | POA: Diagnosis present

## 2020-12-22 DIAGNOSIS — E1169 Type 2 diabetes mellitus with other specified complication: Secondary | ICD-10-CM | POA: Diagnosis not present

## 2020-12-22 DIAGNOSIS — E860 Dehydration: Secondary | ICD-10-CM | POA: Diagnosis present

## 2020-12-22 DIAGNOSIS — Z888 Allergy status to other drugs, medicaments and biological substances status: Secondary | ICD-10-CM

## 2020-12-22 DIAGNOSIS — Z793 Long term (current) use of hormonal contraceptives: Secondary | ICD-10-CM

## 2020-12-22 DIAGNOSIS — R1084 Generalized abdominal pain: Secondary | ICD-10-CM

## 2020-12-22 DIAGNOSIS — E1165 Type 2 diabetes mellitus with hyperglycemia: Secondary | ICD-10-CM | POA: Diagnosis present

## 2020-12-22 DIAGNOSIS — K3184 Gastroparesis: Secondary | ICD-10-CM | POA: Diagnosis present

## 2020-12-22 DIAGNOSIS — F419 Anxiety disorder, unspecified: Secondary | ICD-10-CM | POA: Diagnosis present

## 2020-12-22 DIAGNOSIS — F1210 Cannabis abuse, uncomplicated: Secondary | ICD-10-CM | POA: Diagnosis present

## 2020-12-22 DIAGNOSIS — E1159 Type 2 diabetes mellitus with other circulatory complications: Secondary | ICD-10-CM | POA: Diagnosis not present

## 2020-12-22 DIAGNOSIS — F1219 Cannabis abuse with unspecified cannabis-induced disorder: Secondary | ICD-10-CM | POA: Diagnosis present

## 2020-12-22 DIAGNOSIS — F17210 Nicotine dependence, cigarettes, uncomplicated: Secondary | ICD-10-CM | POA: Diagnosis present

## 2020-12-22 DIAGNOSIS — I152 Hypertension secondary to endocrine disorders: Secondary | ICD-10-CM | POA: Diagnosis not present

## 2020-12-22 DIAGNOSIS — I10 Essential (primary) hypertension: Secondary | ICD-10-CM | POA: Diagnosis present

## 2020-12-22 LAB — HEPATIC FUNCTION PANEL
ALT: 14 U/L (ref 0–44)
AST: 18 U/L (ref 15–41)
Albumin: 3.5 g/dL (ref 3.5–5.0)
Alkaline Phosphatase: 60 U/L (ref 38–126)
Bilirubin, Direct: <0.1 mg/dL (ref 0.0–0.2)
Total Bilirubin: 0.9 mg/dL (ref 0.3–1.2)
Total Protein: 8.1 g/dL (ref 6.5–8.1)

## 2020-12-22 LAB — URINALYSIS, ROUTINE W REFLEX MICROSCOPIC
Bacteria, UA: NONE SEEN
Bilirubin Urine: NEGATIVE
Glucose, UA: >=500 mg/dL — AB
Ketones, ur: 20 mg/dL — AB
Leukocytes,Ua: NEGATIVE
Nitrite: NEGATIVE
Protein, ur: 100 mg/dL — AB
Specific Gravity, Urine: 1.013 (ref 1.005–1.030)
pH: 8 (ref 5.0–8.0)

## 2020-12-22 LAB — LIPASE, BLOOD: Lipase: 31 U/L (ref 11–51)

## 2020-12-22 LAB — CBG MONITORING, ED
Glucose-Capillary: 352 mg/dL — ABNORMAL HIGH (ref 70–99)
Glucose-Capillary: 416 mg/dL — ABNORMAL HIGH (ref 70–99)
Glucose-Capillary: 324 mg/dL — ABNORMAL HIGH (ref 70–99)
Glucose-Capillary: 183 mg/dL — ABNORMAL HIGH (ref 70–99)
Glucose-Capillary: 109 mg/dL — ABNORMAL HIGH (ref 70–99)
Glucose-Capillary: 108 mg/dL — ABNORMAL HIGH (ref 70–99)

## 2020-12-22 LAB — CBC
HCT: 41.4 % (ref 36.0–46.0)
Hemoglobin: 13.9 g/dL (ref 12.0–15.0)
MCH: 29.2 pg (ref 26.0–34.0)
MCHC: 33.6 g/dL (ref 30.0–36.0)
MCV: 87 fL (ref 80.0–100.0)
Platelets: 304 10*3/uL (ref 150–400)
RBC: 4.76 MIL/uL (ref 3.87–5.11)
RDW: 12.7 % (ref 11.5–15.5)
WBC: 7.3 10*3/uL (ref 4.0–10.5)
nRBC: 0 % (ref 0.0–0.2)

## 2020-12-22 LAB — BLOOD GAS, VENOUS
Acid-Base Excess: 4.5 mmol/L — ABNORMAL HIGH (ref 0.0–2.0)
Bicarbonate: 26.7 mmol/L (ref 20.0–28.0)
O2 Saturation: 83.8 %
Patient temperature: 37
pCO2, Ven: 32 mmHg — ABNORMAL LOW (ref 44.0–60.0)
pH, Ven: 7.53 — ABNORMAL HIGH (ref 7.250–7.430)
pO2, Ven: 42 mmHg (ref 32.0–45.0)

## 2020-12-22 LAB — POC URINE PREG, ED: Preg Test, Ur: NEGATIVE

## 2020-12-22 LAB — BASIC METABOLIC PANEL
Anion gap: 12 (ref 5–15)
BUN: 12 mg/dL (ref 6–20)
CO2: 28 mmol/L (ref 22–32)
Calcium: 12.6 mg/dL — ABNORMAL HIGH (ref 8.9–10.3)
Chloride: 93 mmol/L — ABNORMAL LOW (ref 98–111)
Creatinine, Ser: 1.06 mg/dL — ABNORMAL HIGH (ref 0.44–1.00)
GFR, Estimated: >60 mL/min (ref >60)
Glucose, Bld: 358 mg/dL — ABNORMAL HIGH (ref 70–99)
Potassium: 4.8 mmol/L (ref 3.5–5.1)
Sodium: 133 mmol/L — ABNORMAL LOW (ref 135–145)

## 2020-12-22 LAB — MAGNESIUM: Magnesium: 1.2 mg/dL — ABNORMAL LOW (ref 1.7–2.4)

## 2020-12-22 LAB — RESP PANEL BY RT-PCR (FLU A&B, COVID) ARPGX2
Influenza A by PCR: NEGATIVE
Influenza B by PCR: NEGATIVE
SARS Coronavirus 2 by RT PCR: NEGATIVE

## 2020-12-22 LAB — BETA-HYDROXYBUTYRIC ACID: Beta-Hydroxybutyric Acid: 2.58 mmol/L — ABNORMAL HIGH (ref 0.05–0.27)

## 2020-12-22 MED ORDER — METOCLOPRAMIDE HCL 5 MG/ML IJ SOLN
10.0000 mg | Freq: Three times a day (TID) | INTRAMUSCULAR | Status: DC
  Administered 2020-12-22 – 2020-12-23 (×3): 10 mg via INTRAVENOUS
  Filled 2020-12-22 (×3): qty 2

## 2020-12-22 MED ORDER — SERTRALINE HCL 50 MG PO TABS
100.0000 mg | ORAL_TABLET | Freq: Every day | ORAL | Status: DC
  Administered 2020-12-22 – 2020-12-23 (×2): 100 mg via ORAL
  Filled 2020-12-22 (×2): qty 2

## 2020-12-22 MED ORDER — SODIUM CHLORIDE 0.9 % IV SOLN: INTRAVENOUS | Status: DC

## 2020-12-22 MED ORDER — INSULIN ASPART PROT & ASPART (70-30 MIX) 100 UNIT/ML ~~LOC~~ SUSP: 16.0000 [IU] | Freq: Two times a day (BID) | SUBCUTANEOUS | Status: DC

## 2020-12-22 MED ORDER — MORPHINE SULFATE (PF) 2 MG/ML IV SOLN
2.0000 mg | Freq: Once | INTRAVENOUS | Status: AC
  Administered 2020-12-22: 2 mg via INTRAVENOUS
  Filled 2020-12-22: qty 1

## 2020-12-22 MED ORDER — PANTOPRAZOLE SODIUM 40 MG IV SOLR
40.0000 mg | Freq: Once | INTRAVENOUS | Status: AC
  Administered 2020-12-22: 40 mg via INTRAVENOUS
  Filled 2020-12-22: qty 40

## 2020-12-22 MED ORDER — LACTATED RINGERS IV BOLUS
1000.0000 mL | Freq: Once | INTRAVENOUS | Status: AC
  Administered 2020-12-22: 1000 mL via INTRAVENOUS

## 2020-12-22 MED ORDER — MAGNESIUM SULFATE 4 GM/100ML IV SOLN
4.0000 g | Freq: Once | INTRAVENOUS | Status: AC
  Administered 2020-12-22: 4 g via INTRAVENOUS
  Filled 2020-12-22: qty 100

## 2020-12-22 MED ORDER — ENOXAPARIN SODIUM 60 MG/0.6ML IJ SOSY
0.5000 mg/kg | PREFILLED_SYRINGE | INTRAMUSCULAR | Status: DC
  Administered 2020-12-22: 47.5 mg via SUBCUTANEOUS
  Filled 2020-12-22: qty 0.6

## 2020-12-22 MED ORDER — TRAZODONE HCL 50 MG PO TABS
50.0000 mg | ORAL_TABLET | Freq: Every evening | ORAL | Status: DC | PRN
  Administered 2020-12-22: 50 mg via ORAL
  Filled 2020-12-22: qty 1

## 2020-12-22 MED ORDER — PANTOPRAZOLE SODIUM 40 MG IV SOLR
40.0000 mg | INTRAVENOUS | Status: DC
  Administered 2020-12-23: 40 mg via INTRAVENOUS
  Filled 2020-12-22: qty 40

## 2020-12-22 MED ORDER — INSULIN DETEMIR 100 UNIT/ML ~~LOC~~ SOLN
20.0000 [IU] | Freq: Every day | SUBCUTANEOUS | Status: DC
  Administered 2020-12-22: 20 [IU] via SUBCUTANEOUS
  Filled 2020-12-22 (×2): qty 0.2

## 2020-12-22 MED ORDER — ONDANSETRON HCL 4 MG/2ML IJ SOLN
4.0000 mg | Freq: Once | INTRAMUSCULAR | Status: AC
  Administered 2020-12-22: 4 mg via INTRAVENOUS
  Filled 2020-12-22: qty 2

## 2020-12-22 MED ORDER — SODIUM CHLORIDE 0.45 % IV SOLN: INTRAVENOUS | Status: DC

## 2020-12-22 MED ORDER — CARVEDILOL 6.25 MG PO TABS
25.0000 mg | ORAL_TABLET | Freq: Two times a day (BID) | ORAL | Status: DC
  Administered 2020-12-22 – 2020-12-23 (×3): 25 mg via ORAL
  Filled 2020-12-22: qty 4
  Filled 2020-12-22 (×2): qty 1

## 2020-12-22 MED ORDER — MORPHINE SULFATE (PF) 4 MG/ML IV SOLN
6.0000 mg | Freq: Once | INTRAVENOUS | Status: AC
  Administered 2020-12-22: 6 mg via INTRAVENOUS
  Filled 2020-12-22: qty 2

## 2020-12-22 MED ORDER — METOCLOPRAMIDE HCL 5 MG/ML IJ SOLN
10.0000 mg | Freq: Once | INTRAMUSCULAR | Status: AC
  Administered 2020-12-22: 10 mg via INTRAVENOUS
  Filled 2020-12-22: qty 2

## 2020-12-22 MED ORDER — BUSPIRONE HCL 5 MG PO TABS
10.0000 mg | ORAL_TABLET | Freq: Two times a day (BID) | ORAL | Status: DC
  Administered 2020-12-22 (×2): 10 mg via ORAL
  Filled 2020-12-22 (×2): qty 2

## 2020-12-22 MED ORDER — HYDRALAZINE HCL 20 MG/ML IJ SOLN
10.0000 mg | Freq: Four times a day (QID) | INTRAMUSCULAR | Status: DC | PRN
  Administered 2020-12-22: 10 mg via INTRAVENOUS
  Filled 2020-12-22: qty 1

## 2020-12-22 MED ORDER — NIFEDIPINE ER OSMOTIC RELEASE 90 MG PO TB24
90.0000 mg | ORAL_TABLET | Freq: Every day | ORAL | Status: DC
  Filled 2020-12-22 (×4): qty 1

## 2020-12-22 MED ORDER — HYDRALAZINE HCL 10 MG PO TABS
10.0000 mg | ORAL_TABLET | Freq: Three times a day (TID) | ORAL | Status: DC
  Administered 2020-12-22: 10 mg via ORAL
  Filled 2020-12-22 (×3): qty 1

## 2020-12-22 MED ORDER — INSULIN ASPART 100 UNIT/ML IJ SOLN
0.0000 [IU] | INTRAMUSCULAR | Status: DC
Start: 2020-12-22 — End: 2020-12-23
  Administered 2020-12-22: 11 [IU] via SUBCUTANEOUS
  Administered 2020-12-22: 15 [IU] via SUBCUTANEOUS
  Filled 2020-12-22 (×2): qty 1

## 2020-12-22 NOTE — ED Provider Notes (Signed)
[facility] Emergency Department Provider Note  ____________________________________________   Event Date/Time   First MD Initiated Contact with Patient 12/22/20 [phone]     (approximate)  I have reviewed the triage vital signs and the nursing notes.   HISTORY  Chief Complaint Hyperglycemia and Emesis   HPI Cheyenne Davenport is a 30 y.o. female  with medical history significant for type 1 diabetes, essential hypertension, PCOS on metformin, chronic anxiety/depression, obesity and recent admission for DKA and colitis associate with nausea vomiting diarrhea abdominal pain who presents for assessment of approximately 4 days of return of crampy generalized abdominal pain associated with nonbloody nonbilious vomiting and nausea.  Patient denies any diarrhea, urinary symptoms, back pain earache, sore throat, congestion, chest pain, cough, shortness of breath, rash or other acute sick symptoms.  States she did not take her insulin or blood pressure medicines this morning due to nausea.  Otherwise has been taking her insulin.  She denies any illicit drug use, EtOH use but does endorse tobacco abuse.  No other acute concerns at this time.         Past Medical History:  Diagnosis Date   Anxiety    Depression    Diabetes mellitus without complication ([place])    type 1    Hypertension     Patient Active Problem List   Diagnosis Date Noted   DKA, type 1 ([place]) 12/08/2020    Past Surgical History:  Procedure Laterality Date   CESAREAN SECTION      Prior to Admission medications   Medication Sig Start Date End Date Taking? Authorizing Provider  busPIRone (BUSPAR) 10 MG tablet Take 10 mg by mouth 2 (two) times daily.    [provider]  carvedilol (COREG) 25 MG tablet Take 25 mg by mouth 2 (two) times daily with a meal.    [provider]  hydrALAZINE (APRESOLINE) 10 MG tablet Take 10 mg by mouth 3 (three) times daily.    [provider]   insulin aspart protamine- aspart (NOVOLOG MIX 70/30) (70-30) 100 UNIT/ML injection Inject 0.16 mLs (16 Units total) into the skin 2 (two) times daily with a meal. Breakfast and dinner. 12/10/20 03/10/21  Enzo Bi, MD  metFORMIN (GLUCOPHAGE-XR) 500 MG 24 hr tablet Take 1,000 mg by mouth 2 (two) times daily.    [provider]  NIFEdipine (PROCARDIA XL/NIFEDICAL-XL) 90 MG 24 hr tablet Take 90 mg by mouth daily.    [provider]  sertraline (ZOLOFT) 50 MG tablet Take 100 mg by mouth daily.    [provider]  traZODone (DESYREL) 50 MG tablet Take 50 mg by mouth at bedtime as needed for sleep.    [provider]    Allergies Shellfish-derived products and Lisinopril  No family history on file.  Social History Social History   Tobacco Use   Smoking status: Every Day    Packs/day: 1.00    Types: Cigarettes   Smokeless tobacco: Never  Substance Use Topics   Alcohol use: Not Currently   Drug use: Not Currently    Review of Systems  Review of Systems  Constitutional:  Positive for malaise/fatigue. Negative for chills and fever.  HENT:  Negative for sore throat.   Eyes:  Negative for pain.  Respiratory:  Negative for cough and stridor.   Cardiovascular:  Negative for chest pain.  Gastrointestinal:  Positive for abdominal pain, nausea and vomiting.  Skin:  Negative for rash.  Neurological:  Positive for weakness. Negative  for seizures, loss of consciousness and headaches.  Psychiatric/Behavioral:  Negative for suicidal ideas.   All other systems reviewed and are negative.    ____________________________________________   PHYSICAL EXAM:  VITAL SIGNS: ED Triage Vitals  Enc Vitals Group     BP 12/22/20 0803 (!) 178/112     Pulse Rate 12/22/20 0803 85     Resp 12/22/20 0803 17     Temp 12/22/20 0803 98.5 F (36.9 C)     Temp Source 12/22/20 0803 Oral     SpO2 12/22/20 0803 94 %     Weight 12/22/20 0804 210 lb (95.3 kg)     Height  12/22/20 0804 5\' 4"  (1.626 m)     Head Circumference --      Peak Flow --      Pain Score 12/22/20 0804 6     Pain Loc --      Pain Edu? --      Excl. in GC? --    Vitals:   12/22/20 1045 12/22/20 1052  BP:  (!) 170/109  Pulse:  (!) 109  Resp: 12 18  Temp:  97.8 F (36.6 C)  SpO2: 100% 100%   Physical Exam Vitals and nursing note reviewed.  Constitutional:      General: She is not in acute distress.    Appearance: She is well-developed.  HENT:     Head: Normocephalic and atraumatic.     Right Ear: External ear normal.     Left Ear: External ear normal.     Nose: Nose normal.     Mouth/Throat:     Mouth: Mucous membranes are dry.  Eyes:     Conjunctiva/sclera: Conjunctivae normal.  Cardiovascular:     Rate and Rhythm: Normal rate and regular rhythm.     Heart sounds: No murmur heard. Pulmonary:     Effort: Pulmonary effort is normal. No respiratory distress.     Breath sounds: Normal breath sounds.  Abdominal:     Palpations: Abdomen is soft.     Tenderness: There is no abdominal tenderness.  Musculoskeletal:        General: No swelling.     Cervical back: Neck supple.  Skin:    General: Skin is warm and dry.     Capillary Refill: Capillary refill takes more than 3 seconds.  Neurological:     Mental Status: She is alert and oriented to person, place, and time.  Psychiatric:        Mood and Affect: Mood normal.     ____________________________________________   LABS (all labs ordered are listed, but only abnormal results are displayed)  Labs Reviewed  BASIC METABOLIC PANEL - Abnormal; Notable for the following components:      Result Value   Sodium 133 (*)    Chloride 93 (*)    Glucose, Bld 358 (*)    Creatinine, Ser 1.06 (*)    Calcium 12.6 (*)    All other components within normal limits  URINALYSIS, ROUTINE W REFLEX MICROSCOPIC - Abnormal; Notable for the following components:   Color, Urine STRAW (*)    APPearance CLEAR (*)    Glucose, UA >=500  (*)    Hgb urine dipstick SMALL (*)    Ketones, ur 20 (*)    Protein, ur 100 (*)    All other components within normal limits  BETA-HYDROXYBUTYRIC ACID - Abnormal; Notable for the following components:   Beta-Hydroxybutyric Acid 2.58 (*)    All other components within normal  limits  BLOOD GAS, VENOUS - Abnormal; Notable for the following components:   pH, Ven 7.53 (*)    pCO2, Ven 32 (*)    Acid-Base Excess 4.5 (*)    All other components within normal limits  MAGNESIUM - Abnormal; Notable for the following components:   Magnesium 1.2 (*)    All other components within normal limits  CBG MONITORING, ED - Abnormal; Notable for the following components:   Glucose-Capillary 352 (*)    All other components within normal limits  CBG MONITORING, ED - Abnormal; Notable for the following components:   Glucose-Capillary 416 (*)    All other components within normal limits  RESP PANEL BY RT-PCR (FLU A&B, COVID) ARPGX2  CBC  LIPASE, BLOOD  HEPATIC FUNCTION PANEL  CBG MONITORING, ED  POC URINE PREG, ED   ____________________________________________  EKG  Sinus rhythm with a ventricular rate of 86, left axis deviation, unremarkable intervals without evidence of acute ischemia or significant arrhythmia. ____________________________________________  RADIOLOGY  ED MD interpretation:    Official radiology report(s): No results found.  ____________________________________________   PROCEDURES  Procedure(s) performed (including Critical Care):  .1-3 Lead EKG Interpretation Performed by: Lucrezia Starch, MD Authorized by: Lucrezia Starch, MD     Interpretation: non-specific     ECG rate assessment: tachycardic     Rhythm: sinus tachycardia     Ectopy: none     Conduction: normal     ____________________________________________   INITIAL IMPRESSION / ASSESSMENT AND PLAN / ED COURSE      Patient presents with above-stated history exam for assessment of several days of  generalized crampy abdominal pain associate with nausea and vomiting.  She states she has been taking her insulin other than today.  On arrival patient is hypertensive with otherwise stable vital signs on room air.  For some mild tenderness throughout her abdomen but no significant tenderness on deep palpation and she is not rigid or guarding.  She does appear dehydrated.  Her lungs are clear bilaterally.  She is awake and alert with a nonfocal supine neurological exam.  Differential includes metabolic derangements including DKA and HHS, pancreatitis, cholecystitis, cystitis, recurrence of acute infectious gastroenteritis, kidney stone, atypical presentation for ACS and gastroparesis.  BMP shows a glucose of 358 with a bicarb of 28 and anion gap of 12.  There is some pseudohyponatremia with a sodium of 133 without any other acute significant electrolyte metabolic derangements.  CBC shows no cytosis or acute anemia.  UA has some ketones and protein but no evidence of infection.  Lipase not consistent with acute pancreatitis.  Hepatic function panel not suggestive of acute hepatitis or cholestatic process.  VBG with a pH of 7.53 with a PCO2 of 32 bicarb 26.7.  Overall this is not consistent with DKA.  Magnesium is 1.2.  EKG not suggestive of atypical presentation for ACS.  Patient is still too nauseous to have a sip of water after 10 of Reglan for Zofran and below noted analgesia Protonix.  Suspect acute recurrent gastritis possibly an element of gastroparesis.  Her blood pressure has improved after she was given doses of her home BP meds.  However given intractability of nausea will admit to medicine service for further evaluation and management.        ____________________________________________   FINAL CLINICAL IMPRESSION(S) / ED DIAGNOSES  Final diagnoses:  Nausea and vomiting, unspecified vomiting type  Generalized abdominal pain  Hypomagnesemia  Hyperglycemia    Medications   hydrALAZINE (APRESOLINE)  tablet 10 mg (10 mg Oral Given 12/22/20 0940)  carvedilol (COREG) tablet 25 mg (25 mg Oral Given 12/22/20 0905)  insulin aspart (novoLOG) injection 0-15 Units (15 Units Subcutaneous Given 12/22/20 1039)  magnesium sulfate IVPB 4 g 100 mL (4 g Intravenous New Bag/Given 12/22/20 1048)  NIFEdipine (PROCARDIA XL/NIFEDICAL-XL) 24 hr tablet 90 mg (has no administration in time range)  lactated ringers bolus 1,000 mL (1,000 mLs Intravenous New Bag/Given 12/22/20 0904)  ondansetron (ZOFRAN) injection 4 mg (4 mg Intravenous Given 12/22/20 0859)  pantoprazole (PROTONIX) injection 40 mg (40 mg Intravenous Given 12/22/20 0901)  morphine 4 MG/ML injection 6 mg (6 mg Intravenous Given 12/22/20 1037)  metoCLOPramide (REGLAN) injection 10 mg (10 mg Intravenous Given 12/22/20 1041)     ED Discharge Orders     None        Note:  This document was prepared using Dragon voice recognition software and may include unintentional dictation errors.    Lucrezia Starch, MD 12/22/20 1115

## 2020-12-22 NOTE — ED Triage Notes (Signed)
Pt c/o generalized abd pain with N/V for the past couple of days, states her glucose has been running in the 300's, states she was recently here for the same and they changed his insulin to 70/30

## 2020-12-22 NOTE — H&P (Signed)
History and Physical    Cheyenne Davenport ERX:[id number] DOB: Jul 04, 1990 DOA: 12/22/2020  PCP: Gildardo Pounds, PA   Patient coming from: Home  I have personally briefly reviewed patient's old medical records in [facility]  Chief Complaint: Abdominal Pain  HPI: Cheyenne Davenport is a 30 y.o. female with medical history significant for insulin-dependent type 1 diabetes mellitus, hypertension, PCOS on metformin, anxiety and depression as well as obesity who presents to the ER for evaluation of persistent nausea, vomiting and abdominal pain for 2 days. She was recently discharged from the hospital about 2 weeks ago for same and at that time had mild DKA as well as colitis. She states that she was in her usual state of health until 2 days prior to her admission when she developed nausea and vomiting.  She admits to marijuana use.  She also complains of abdominal pain mostly in the periumbilical area which she rates a 6 x 10 in intensity at its worst.  She denies having any diarrhea or constipation.  She has had chills but denies having any fever.   She denies having any cough, no headache, no palpitations, no diaphoresis, no dizziness, no lightheadedness, no urinary symptoms, no sore throat, no blurred vision episode. VBG  7.53/32/42/26.7/83.8 Sodium 133, potassium 4.8, chloride 93, bicarb 28, glucose 258, BUN 12, creatinine 1.06, calcium 12.6 magnesium 1.2 alkaline phosphatase 60, albumin 3.5, lipase 31, AST 18, ALT 14, total protein 8.1, white count 7.3, hemoglobin 13.9, hematocrit 41.4, MCV 87.0, RDW 12.7, platelet count 204, beta hydroxybutyric acid 2.58 Respiratory viral panel is negative Urine analysis is sterile Twelve-lead EKG reviewed by me shows sinus rhythm with left axis deviation.  ED Course: Patient is a 30 year old female who presents to the ER for evaluation of a 2-day history of nausea, vomiting and abdominal pain. Labs reveal hyperglycemia and hypercalcemia. She will be  admitted to the hospital for further evaluation.   Review of Systems: As per HPI otherwise all other systems reviewed and negative.    Past Medical History:  Diagnosis Date   Anxiety    Depression    Diabetes mellitus without complication (HCC)    type 1    Hypertension     Past Surgical History:  Procedure Laterality Date   CESAREAN SECTION       reports that she has been smoking cigarettes. She has been smoking an average of 1 pack per day. She has never used smokeless tobacco. She reports that she does not currently use alcohol. She reports that she does not currently use drugs.  Allergies  Allergen Reactions   Shellfish-Derived Products Hives   Lisinopril Nausea Only and Nausea And Vomiting    History reviewed. No pertinent family history.    Prior to Admission medications   Medication Sig Start Date End Date Taking? Authorizing Provider  busPIRone (BUSPAR) 10 MG tablet Take 10 mg by mouth 2 (two) times daily.   Yes [provider]  carvedilol (COREG) 25 MG tablet Take 25 mg by mouth 2 (two) times daily with a meal.   Yes [provider]  insulin aspart protamine- aspart (NOVOLOG MIX 70/30) (70-30) 100 UNIT/ML injection Inject 0.16 mLs (16 Units total) into the skin 2 (two) times daily with a meal. Breakfast and dinner. Patient taking differently: Inject 18 Units into the skin 2 (two) times daily with a meal. Breakfast and dinner. 12/10/20 03/10/21 Yes Darlin Priestly, MD  irbesartan (AVAPRO) 75 MG tablet Take 75 mg by mouth daily. 12/03/20  Yes [provider]  metFORMIN (GLUCOPHAGE-XR) 500 MG 24 hr tablet Take 1,000 mg by mouth 2 (two) times daily.   Yes [provider]  NIFEdipine (PROCARDIA XL/NIFEDICAL-XL) 90 MG 24 hr tablet Take 90 mg by mouth daily.   Yes [provider]  norgestimate-ethinyl estradiol (ORTHO-CYCLEN) 0.25-35 MG-MCG tablet Take 1 tablet by mouth daily.   Yes [provider]  sertraline (ZOLOFT) 50 MG  tablet Take 100 mg by mouth daily.   Yes [provider]  traZODone (DESYREL) 50 MG tablet Take 50 mg by mouth at bedtime as needed for sleep.   Yes [provider]  traZODone (DESYREL) 50 MG tablet Take 1 tablet by mouth at bedtime. 12/08/16  Yes [provider]  hydrALAZINE (APRESOLINE) 10 MG tablet Take 10 mg by mouth 3 (three) times daily. Patient not taking: Reported on 12/22/2020    [provider]    Physical Exam: Vitals:   12/22/20 1045 12/22/20 1052 12/22/20 1100 12/22/20 1214  BP:  (!) 170/109 (!) 156/87 131/75  Pulse:  (!) 109 93 (!) 104  Resp: 12 18 15 17   Temp:  97.8 F (36.6 C)    TempSrc:  Oral    SpO2: 100% 100% 100% 98%  Weight:      Height:         Vitals:   12/22/20 1045 12/22/20 1052 12/22/20 1100 12/22/20 1214  BP:  (!) 170/109 (!) 156/87 131/75  Pulse:  (!) 109 93 (!) 104  Resp: 12 18 15 17   Temp:  97.8 F (36.6 C)    TempSrc:  Oral    SpO2: 100% 100% 100% 98%  Weight:      Height:          Constitutional: Alert and oriented x 3. Obesity. Appears uncomfortable and is writhing around in bed HEENT:      Head: Normocephalic and atraumatic.         Eyes: PERLA, EOMI, Conjunctivae are normal. Sclera is non-icteric.       Mouth/Throat: Mucous membranes are dry.       Neck: Supple with no signs of meningismus. Cardiovascular: Tachycardia. No murmurs, gallops, or rubs. 2+ symmetrical distal pulses are present . No JVD. No LE edema Respiratory: Respiratory effort normal .Lungs sounds clear bilaterally. No wheezes, crackles, or rhonchi.  Gastrointestinal: Soft, diffusely tender, and non distended with positive bowel sounds.  Genitourinary: No CVA tenderness. Musculoskeletal: Nontender with normal range of motion in all extremities. No cyanosis, or erythema of extremities. Neurologic:  Face is symmetric. Moving all extremities. No gross focal neurologic deficits . Skin: Skin is warm, dry.  No rash or ulcers Psychiatric:  Anxious    Labs on Admission: I have personally reviewed following labs and imaging studies  CBC: Recent Labs  Lab 12/22/20 0808  WBC 7.3  HGB 13.9  HCT 41.4  MCV 87.0  PLT 123456   Basic Metabolic Panel: Recent Labs  Lab 12/22/20 0808  NA 133*  K 4.8  CL 93*  CO2 28  GLUCOSE 358*  BUN 12  CREATININE 1.06*  CALCIUM 12.6*  MG 1.2*   GFR: Estimated Creatinine Clearance: 85.3 mL/min (A) (by C-G formula based on SCr of 1.06 mg/dL (H)). Liver Function Tests: Recent Labs  Lab 12/22/20 0808  AST 18  ALT 14  ALKPHOS 60  BILITOT 0.9  PROT 8.1  ALBUMIN 3.5   Recent Labs  Lab 12/22/20 0808  LIPASE 31   No results for input(s): AMMONIA in  the last 168 hours. Coagulation Profile: No results for input(s): INR, PROTIME in the last 168 hours. Cardiac Enzymes: No results for input(s): CKTOTAL, CKMB, CKMBINDEX, TROPONINI in the last 168 hours. BNP (last 3 results) No results for input(s): PROBNP in the last 8760 hours. HbA1C: No results for input(s): HGBA1C in the last 72 hours. CBG: Recent Labs  Lab 12/22/20 0804 12/22/20 1012 12/22/20 1211  GLUCAP 352* 416* 324*   Lipid Profile: No results for input(s): CHOL, HDL, LDLCALC, TRIG, CHOLHDL, LDLDIRECT in the last 72 hours. Thyroid Function Tests: No results for input(s): TSH, T4TOTAL, FREET4, T3FREE, THYROIDAB in the last 72 hours. Anemia Panel: No results for input(s): VITAMINB12, FOLATE, FERRITIN, TIBC, IRON, RETICCTPCT in the last 72 hours. Urine analysis:    Component Value Date/Time   COLORURINE STRAW (A) 12/22/2020 0808   APPEARANCEUR CLEAR (A) 12/22/2020 0808   LABSPEC 1.013 12/22/2020 0808   PHURINE 8.0 12/22/2020 0808   GLUCOSEU >=500 (A) 12/22/2020 0808   HGBUR SMALL (A) 12/22/2020 0808   BILIRUBINUR NEGATIVE 12/22/2020 0808   KETONESUR 20 (A) 12/22/2020 0808   PROTEINUR 100 (A) 12/22/2020 0808   NITRITE NEGATIVE 12/22/2020 0808   LEUKOCYTESUR NEGATIVE 12/22/2020 0808    Radiological Exams on  Admission: No results found.   Assessment/Plan Principal Problem:   Refractory nausea and vomiting Active Problems:   Diabetes mellitus with hyperglycemia (HCC)   Depression   Hypertension   Hypercalcemia   Anxiety   Cannabis hyperemesis syndrome concurrent with and due to cannabis abuse [facility])     Patient is a 30 year old female who presents to the ER for evaluation of refractory nausea and vomiting.    Refractory nausea and vomiting Probably secondary to cannabis hyperemesis syndrome versus diabetic gastroparesis. Keep patient n.p.o. Supportive care with IV fluid hydration, antiemetics, IV PPI     Diabetes mellitus with hyperglycemia Patient has a history of type 1 diabetes mellitus Hold 70/30 insulin since patient is n.p.o. Will place patient on Levemir 20 units daily with sliding scale coverage for glycemic control     Hypercalcemia Unclear etiology Serum calcium elevated at 12 Will hydrate patient and repeat calcium levels in a.m.     Uncontrolled hypertension Place patient on as needed hydralazine Resume carvedilol and nifedipine    Depression and anxiety Continue sertraline and trazodone   DVT prophylaxis: Lovenox  Code Status: full code  Family Communication: Greater than 50% of time was spent discussing patient's condition and plan of care with her at the bedside.  All questions and concerns have been addressed.  She verbalizes understanding and agrees with the plan. Disposition Plan: Back to previous home environment Consults called: none  Status:At the time of admission, it appears that the appropriate admission status for this patient is inpatient. This is judged to be reasonable and necessary to provide the required intensity of service to ensure the patient's safety given the presenting symptoms, physical exam findings, and initial radiographic and laboratory data in the context of their comorbid conditions. Patient requires inpatient status  due to high intensity of service, high risk for further deterioration and high frequency of surveillance required.     Collier Bullock MD [facility]     12/22/2020, 1:56 PM

## 2020-12-22 NOTE — ED Notes (Signed)
Pt asleep at this time

## 2020-12-22 NOTE — Progress Notes (Signed)
Inpatient Diabetes Program Recommendations  AACE/ADA: New Consensus Statement on Inpatient Glycemic Control   Target Ranges:  Prepandial:   less than 140 mg/dL      Peak postprandial:   less than 180 mg/dL (1-2 hours)      Critically ill patients:  140 - 180 mg/dL     Latest Reference Range & Units 12/22/20 08:08  CO2 22 - 32 mmol/L 28  Glucose 70 - 99 mg/dL 124 (H)  Anion gap 5 - 15  12  (H): Data is abnormally high  Latest Reference Range & Units 12/08/20 16:31  Hemoglobin A1C 4.8 - 5.6 % 10.9 (H)  (H): Data is abnormally high  Review of Glycemic Control  Diabetes history: DM1 (does NOT make any insulin; requires basal, correction, and carb coverage insulin) Outpatient Diabetes medications: 70/30 16 units BID Current orders for Inpatient glycemic control: None; in ED  Inpatient Diabetes Program Recommendations:    Insulin: If patient is admitted, please be sure to order basal, correction, and carb coverage insulin. If patient admitted and NPO, would recommend Levemir 10 units BID, Novolog 0-9 units Q4H. If patient admitted, ordered diet, and tolerating PO intake, would recommend 70/30 15 units BID, CBGs AC&HS, Novolog 0-9 units AC&HS.  NOTE: Patient is currently in the ER with generalized abdominal pain, N/V and hyperglycemia. Per labs on 12/22/20 @ 8:08 am, glucose 358, CO2 28, AG 12. Patient was last inpatient 12/08/20-12/10/20 with DKA precipitated by colitis and was seen by inpatient diabetes coordinator on 12/09/20. Per note on 12/09/20 by Dr. Fran Lowes, "Home regimen is Levemir 13u BID and mealtime 6u TID.  Pt reported she takes Levemir at home but not mealtime coverage. Since pt can only reliably take insulin BID, switch regimen to 70/30 ."  Per discharge summary, patient was discharged on 70/30 16 units BID. Not sure if patient will be admitted at this time or not but will follow along if admitted.  Thanks, Orlando Penner, RN, MSN, CDE Diabetes Coordinator Inpatient Diabetes  Program [phone] (Team Pager from 8am to 5pm)

## 2020-12-22 NOTE — ED Notes (Signed)
Pt in bed, pt denies pain or nausea at this time.

## 2020-12-23 DIAGNOSIS — I152 Hypertension secondary to endocrine disorders: Secondary | ICD-10-CM

## 2020-12-23 DIAGNOSIS — E1065 Type 1 diabetes mellitus with hyperglycemia: Secondary | ICD-10-CM

## 2020-12-23 DIAGNOSIS — E1159 Type 2 diabetes mellitus with other circulatory complications: Secondary | ICD-10-CM

## 2020-12-23 DIAGNOSIS — E1169 Type 2 diabetes mellitus with other specified complication: Secondary | ICD-10-CM

## 2020-12-23 DIAGNOSIS — E669 Obesity, unspecified: Secondary | ICD-10-CM

## 2020-12-23 LAB — BASIC METABOLIC PANEL
Anion gap: 9 (ref 5–15)
BUN: 15 mg/dL (ref 6–20)
CO2: 28 mmol/L (ref 22–32)
Calcium: 10.1 mg/dL (ref 8.9–10.3)
Chloride: 99 mmol/L (ref 98–111)
Creatinine, Ser: 1.22 mg/dL — ABNORMAL HIGH (ref 0.44–1.00)
GFR, Estimated: >60 mL/min (ref >60)
Glucose, Bld: 146 mg/dL — ABNORMAL HIGH (ref 70–99)
Potassium: 3.9 mmol/L (ref 3.5–5.1)
Sodium: 136 mmol/L (ref 135–145)

## 2020-12-23 LAB — CBC
HCT: 37.7 % (ref 36.0–46.0)
Hemoglobin: 12.4 g/dL (ref 12.0–15.0)
MCH: 28.4 pg (ref 26.0–34.0)
MCHC: 32.9 g/dL (ref 30.0–36.0)
MCV: 86.5 fL (ref 80.0–100.0)
Platelets: 294 10*3/uL (ref 150–400)
RBC: 4.36 MIL/uL (ref 3.87–5.11)
RDW: 13 % (ref 11.5–15.5)
WBC: 10.1 10*3/uL (ref 4.0–10.5)
nRBC: 0 % (ref 0.0–0.2)

## 2020-12-23 LAB — CBG MONITORING, ED
Glucose-Capillary: 99 mg/dL (ref 70–99)
Glucose-Capillary: 87 mg/dL (ref 70–99)
Glucose-Capillary: 66 mg/dL — ABNORMAL LOW (ref 70–99)
Glucose-Capillary: 119 mg/dL — ABNORMAL HIGH (ref 70–99)
Glucose-Capillary: 226 mg/dL — ABNORMAL HIGH (ref 70–99)

## 2020-12-23 LAB — MAGNESIUM: Magnesium: 1.4 mg/dL — ABNORMAL LOW (ref 1.7–2.4)

## 2020-12-23 MED ORDER — ONDANSETRON HCL 8 MG PO TABS: 8.0000 mg | ORAL_TABLET | Freq: Three times a day (TID) | ORAL | 0 refills | Status: DC | PRN

## 2020-12-23 MED ORDER — ONDANSETRON HCL 8 MG PO TABS: 8.0000 mg | ORAL_TABLET | Freq: Three times a day (TID) | ORAL | 0 refills | Status: AC | PRN

## 2020-12-23 MED ORDER — ONDANSETRON HCL 4 MG/2ML IJ SOLN: 4.0000 mg | Freq: Three times a day (TID) | INTRAMUSCULAR | Status: DC | PRN

## 2020-12-23 NOTE — Discharge Summary (Addendum)
Physician Discharge Summary  Cheyenne Davenport HGD:[id number] DOB: 1991-01-16 DOA: 12/22/2020  PCP: Gildardo Pounds, PA  Admit date: 12/22/2020 Discharge date: 12/23/2020  Admitted From: home  Disposition:  home   Recommendations for Outpatient Follow-up:  Follow up with PCP in 1-2 weeks   Home Health: no Equipment/Devices:  Discharge Condition: stable  CODE STATUS: full  Diet recommendation: Heart Healthy / Carb Modified   Brief/Interim Summary: HPI was taken from Dr. Joylene Igo: Cheyenne Davenport is a 30 y.o. female with medical history significant for insulin-dependent type 1 diabetes mellitus, hypertension, PCOS on metformin, anxiety and depression as well as obesity who presents to the ER for evaluation of persistent nausea, vomiting and abdominal pain for 2 days. She was recently discharged from the hospital about 2 weeks ago for same and at that time had mild DKA as well as colitis. She states that she was in her usual state of health until 2 days prior to her admission when she developed nausea and vomiting.  She admits to marijuana use.  She also complains of abdominal pain mostly in the periumbilical area which she rates a 6 x 10 in intensity at its worst.  She denies having any diarrhea or constipation.  She has had chills but denies having any fever.   She denies having any cough, no headache, no palpitations, no diaphoresis, no dizziness, no lightheadedness, no urinary symptoms, no sore throat, no blurred vision episode. VBG  7.53/32/42/26.7/83.8 Sodium 133, potassium 4.8, chloride 93, bicarb 28, glucose 258, BUN 12, creatinine 1.06, calcium 12.6 magnesium 1.2 alkaline phosphatase 60, albumin 3.5, lipase 31, AST 18, ALT 14, total protein 8.1, white count 7.3, hemoglobin 13.9, hematocrit 41.4, MCV 87.0, RDW 12.7, platelet count 204, beta hydroxybutyric acid 2.58 Respiratory viral panel is negative Urine analysis is sterile Twelve-lead EKG reviewed by me shows sinus rhythm with left  axis deviation.   ED Course: Patient is a 30 year old female who presents to the ER for evaluation of a 2-day history of nausea, vomiting and abdominal pain. Labs reveal hyperglycemia and hypercalcemia. She will be admitted to the hospital for further evaluation.  Hospital course from Dr. Mayford Knife 12/23/20: Pt's nausea and vomiting had resolved by the time I saw the pt. Pt was able to tolerate a po diet prior to d/c. Of note, pt was counseled on cessation of marijuana use. Pt was ambulating and transferring independently.  Discharge Diagnoses:  Principal Problem:   Refractory nausea and vomiting Active Problems:   Diabetes mellitus with hyperglycemia (HCC)   Depression   Hypertension   Hypercalcemia   Anxiety   Cannabis hyperemesis syndrome concurrent with and due to cannabis abuse (HCC)  Refractory nausea and vomiting: etiology unclear, possibly secondary to cannabis hyperemesis syndrome vs diabetic gastroparesis. Continue on soft diet and advance as tolerated. Continue on IVFs, PPI & IV zofran & reglan.    DM1: poorly controlled, HbA1c 10.9. Continue on levemir, SSI w/ accuchecks   Hypercalcemia: resolved   HTN: uncontrolled. Continue on home dose of carvedilol, nifedipine  Depression: severity unknown. Continue on home dose of sertraline   Obesity: BMI 36.0. Would benefit from weight loss   Discharge Instructions  Discharge Instructions     Diet - low sodium heart healthy   Complete by: As directed    Diet Carb Modified   Complete by: As directed    Discharge instructions   Complete by: As directed    F/u w/ PCP in 1-2 weeks   Increase activity slowly   Complete  by: As directed       Allergies as of 12/23/2020       Reactions   Shellfish-derived Products Hives   Lisinopril Nausea Only, Nausea And Vomiting        Medication List     STOP taking these medications    hydrALAZINE 10 MG tablet Commonly known as: APRESOLINE       TAKE these medications     busPIRone 10 MG tablet Commonly known as: BUSPAR Take 10 mg by mouth 2 (two) times daily.   carvedilol 25 MG tablet Commonly known as: COREG Take 25 mg by mouth 2 (two) times daily with a meal.   insulin aspart protamine- aspart (70-30) 100 UNIT/ML injection Commonly known as: NOVOLOG MIX 70/30 Inject 0.16 mLs (16 Units total) into the skin 2 (two) times daily with a meal. Breakfast and dinner. What changed: how much to take   irbesartan 75 MG tablet Commonly known as: AVAPRO Take 75 mg by mouth daily.   metFORMIN 500 MG 24 hr tablet Commonly known as: GLUCOPHAGE-XR Take 1,000 mg by mouth 2 (two) times daily.   NIFEdipine 90 MG 24 hr tablet Commonly known as: PROCARDIA XL/NIFEDICAL-XL Take 90 mg by mouth daily.   norgestimate-ethinyl estradiol 0.25-35 MG-MCG tablet Commonly known as: ORTHO-CYCLEN Take 1 tablet by mouth daily.   ondansetron 8 MG tablet Commonly known as: Zofran Take 1 tablet (8 mg total) by mouth every 8 (eight) hours as needed for up to 7 days for nausea or vomiting.   sertraline 50 MG tablet Commonly known as: ZOLOFT Take 100 mg by mouth daily.   traZODone 50 MG tablet Commonly known as: DESYREL Take 50 mg by mouth at bedtime as needed for sleep. What changed: Another medication with the same name was removed. Continue taking this medication, and follow the directions you see here.        Allergies  Allergen Reactions   Shellfish-Derived Products Hives   Lisinopril Nausea Only and Nausea And Vomiting    Consultations:    Procedures/Studies: CT Abdomen Pelvis W Contrast  Result Date: 12/08/2020 CLINICAL DATA:  Abdominal pain, acute, nonlocalized EXAM: CT ABDOMEN AND PELVIS WITH CONTRAST TECHNIQUE: Multidetector CT imaging of the abdomen and pelvis was performed using the standard protocol following bolus administration of intravenous contrast. CONTRAST:  130mL OMNIPAQUE IOHEXOL 300 MG/ML  SOLN COMPARISON:  None. FINDINGS: Lower chest: No  acute abnormality. Hepatobiliary: No focal liver abnormality is seen. No gallstones, gallbladder wall thickening, or biliary dilatation. Pancreas: Unremarkable. No pancreatic ductal dilatation or surrounding inflammatory changes. Spleen: Normal in size without focal abnormality. Adrenals/Urinary Tract: Adrenal glands are unremarkable. There is mild right pelvicaliectasis with decompressed ureter and no evidence of obstructing stone. Bladder is unremarkable. Stomach/Bowel: The stomach is within normal limits. There is no evidence of bowel obstruction. The appendix is normal. There is diffuse submucosal thickening of the colon including the rectum, with relative sparing of the sigmoid colon. Vascular/Lymphatic: No significant vascular findings are present. No enlarged abdominal or pelvic lymph nodes. Reproductive: Unremarkable. Other: No abdominal wall hernia or abnormality. No abdominopelvic ascites. Musculoskeletal: No acute osseous abnormality. No suspicious lytic or blastic lesions. Mild degenerative disc height loss at L5-S1. IMPRESSION: Mild diffuse colonic wall thickening including the rectum, with relative sparing of the sigmoid colon. This is consistent with mild colitis. No evidence of bowel obstruction.  Normal appendix. Mild right hydronephrosis with decompressed ureter and no obstructing lesion identified such as a stone. 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 This could be further evaluated with a Mag-3 nuclear medicine renal study and/or CT urogram. Electronically Signed   By: Maurine Simmering M.D.   On: 12/08/2020 15:45   (Echo, Carotid, EGD, Colonoscopy, ERCP)    Subjective: Pt c/o fatigue    Discharge Exam: Vitals:   12/23/20 0700 12/23/20 1034  BP: (!) 128/103 138/84  Pulse: 77   Resp:    Temp:  99 F (37.2 C)  SpO2: 98% 98%   Vitals:   12/23/20 0530 12/23/20 0630 12/23/20 0700 12/23/20 1034  BP: 129/89 119/80 (!) 128/103 138/84  Pulse: 84 82 77   Resp:  17     Temp:    99 F (37.2 C)  TempSrc:      SpO2: 97% 98% 98% 98%  Weight:      Height:        General: Pt is alert, awake, not in acute distress Cardiovascular:  S1/S2 +, no rubs, no gallops Respiratory: CTA bilaterally, no wheezing, no rhonchi Abdominal: Soft, NT, obese, bowel sounds + Extremities: no edema, no cyanosis    The results of significant diagnostics from this hospitalization (including imaging, microbiology, ancillary and laboratory) are listed below for reference.     Microbiology: Recent Results (from the past 240 hour(s))  Resp Panel by RT-PCR (Flu A&B, Covid) Nasopharyngeal Swab     Status: None   Collection Time: 12/22/20 10:52 AM   Specimen: Nasopharyngeal Swab; Nasopharyngeal(NP) swabs in vial transport medium  Result Value Ref Range Status   SARS Coronavirus 2 by RT PCR NEGATIVE NEGATIVE Final    Comment: (NOTE) SARS-CoV-2 target nucleic acids are NOT DETECTED.  The SARS-CoV-2 RNA is generally detectable in upper respiratory specimens during the acute phase of infection. The lowest concentration of SARS-CoV-2 viral copies this assay can detect is 138 copies/mL. A negative result does not preclude SARS-Cov-2 infection and should not be used as the sole basis for treatment or other patient management decisions. A negative result may occur with  improper specimen collection/handling, submission of specimen other than nasopharyngeal swab, presence of viral mutation(s) within the areas targeted by this assay, and inadequate number of viral copies(<138 copies/mL). A negative result must be combined with clinical observations, patient history, and epidemiological information. The expected result is Negative.  Fact Sheet for Patients:  EntrepreneurPulse.com.au  Fact Sheet for Healthcare Providers:  IncredibleEmployment.be  This test is no t yet approved or cleared by the [place] FDA and  has been authorized for  detection and/or diagnosis of SARS-CoV-2 by FDA under an Emergency Use Authorization (EUA). This EUA will remain  in effect (meaning this test can be used) for the duration of the COVID-19 declaration under Section 564(b)(1) of the Act, 21 U.S.C.section 360bbb-3(b)(1), unless the authorization is terminated  or revoked sooner.       Influenza A by PCR NEGATIVE NEGATIVE Final   Influenza B by PCR NEGATIVE NEGATIVE Final    Comment: (NOTE) The Xpert Xpress SARS-CoV-2/FLU/RSV plus assay is intended as an aid in the diagnosis of influenza from Nasopharyngeal swab specimens and should not be used as a sole basis for treatment. Nasal washings and aspirates are unacceptable for Xpert Xpress SARS-CoV-2/FLU/RSV testing.  Fact Sheet for Patients: EntrepreneurPulse.com.au  Fact Sheet for Healthcare Providers: IncredibleEmployment.be  This test is not yet approved or cleared by the [place] FDA and has been authorized for detection and/or diagnosis of SARS-CoV-2 by FDA under an Emergency Use Authorization (EUA). This EUA will remain in effect (  meaning this test can be used) for the duration of the COVID-19 declaration under Section 564(b)(1) of the Act, 21 U.S.C. section 360bbb-3(b)(1), unless the authorization is terminated or revoked.  Performed at [facility], [address] [place], [place] [address]      Labs: BNP (last 3 results) No results for input(s): BNP in the last 8760 hours. Basic Metabolic Panel: Recent Labs  Lab 12/22/20 0808 12/23/20 0500  NA 133* 136  K 4.8 3.9  CL 93* 99  CO2 28 28  GLUCOSE 358* 146*  BUN 12 15  CREATININE 1.06* 1.22*  CALCIUM 12.6* 10.1  MG 1.2* 1.4*   Liver Function Tests: Recent Labs  Lab 12/22/20 0808  AST 18  ALT 14  ALKPHOS 60  BILITOT 0.9  PROT 8.1  ALBUMIN 3.5   Recent Labs  Lab 12/22/20 0808  LIPASE 31   No results for input(s): AMMONIA in the last 168  hours. CBC: Recent Labs  Lab 12/22/20 0808 12/23/20 0500  WBC 7.3 10.1  HGB 13.9 12.4  HCT 41.4 37.7  MCV 87.0 86.5  PLT 304 294   Cardiac Enzymes: No results for input(s): CKTOTAL, CKMB, CKMBINDEX, TROPONINI in the last 168 hours. BNP: Invalid input(s): POCBNP CBG: Recent Labs  Lab 12/23/20 0213 12/23/20 0351 12/23/20 0716 12/23/20 0750 12/23/20 0902  GLUCAP 99 87 66* 119* 226*   D-Dimer No results for input(s): DDIMER in the last 72 hours. Hgb A1c No results for input(s): HGBA1C in the last 72 hours. Lipid Profile No results for input(s): CHOL, HDL, LDLCALC, TRIG, CHOLHDL, LDLDIRECT in the last 72 hours. Thyroid function studies No results for input(s): TSH, T4TOTAL, T3FREE, THYROIDAB in the last 72 hours.  Invalid input(s): FREET3 Anemia work up No results for input(s): VITAMINB12, FOLATE, FERRITIN, TIBC, IRON, RETICCTPCT in the last 72 hours. Urinalysis    Component Value Date/Time   COLORURINE STRAW (A) 12/22/2020 0808   APPEARANCEUR CLEAR (A) 12/22/2020 0808   LABSPEC 1.013 12/22/2020 0808   PHURINE 8.0 12/22/2020 0808   GLUCOSEU >=500 (A) 12/22/2020 0808   HGBUR SMALL (A) 12/22/2020 0808   BILIRUBINUR NEGATIVE 12/22/2020 0808   KETONESUR 20 (A) 12/22/2020 0808   PROTEINUR 100 (A) 12/22/2020 0808   NITRITE NEGATIVE 12/22/2020 0808   LEUKOCYTESUR NEGATIVE 12/22/2020 0808   Sepsis Labs Invalid input(s): PROCALCITONIN,  WBC,  LACTICIDVEN Microbiology Recent Results (from the past 240 hour(s))  Resp Panel by RT-PCR (Flu A&B, Covid) Nasopharyngeal Swab     Status: None   Collection Time: 12/22/20 10:52 AM   Specimen: Nasopharyngeal Swab; Nasopharyngeal(NP) swabs in vial transport medium  Result Value Ref Range Status   SARS Coronavirus 2 by RT PCR NEGATIVE NEGATIVE Final    Comment: (NOTE) SARS-CoV-2 target nucleic acids are NOT DETECTED.  The SARS-CoV-2 RNA is generally detectable in upper respiratory specimens during the acute phase of infection.  The lowest concentration of SARS-CoV-2 viral copies this assay can detect is 138 copies/mL. A negative result does not preclude SARS-Cov-2 infection and should not be used as the sole basis for treatment or other patient management decisions. A negative result may occur with  improper specimen collection/handling, submission of specimen other than nasopharyngeal swab, presence of viral mutation(s) within the areas targeted by this assay, and inadequate number of viral copies(<138 copies/mL). A negative result must be combined with clinical observations, patient history, and epidemiological information. The expected result is Negative.  Fact Sheet for Patients:  EntrepreneurPulse.com.au  Fact Sheet for Healthcare Providers:  IncredibleEmployment.be  This test is no t yet approved or cleared by the [place] and  has been authorized for detection and/or diagnosis of SARS-CoV-2 by FDA under an Emergency Use Authorization (EUA). This EUA will remain  in effect (meaning this test can be used) for the duration of the COVID-19 declaration under Section 564(b)(1) of the Act, 21 U.S.C.section 360bbb-3(b)(1), unless the authorization is terminated  or revoked sooner.       Influenza A by PCR NEGATIVE NEGATIVE Final   Influenza B by PCR NEGATIVE NEGATIVE Final    Comment: (NOTE) The Xpert Xpress SARS-CoV-2/FLU/RSV plus assay is intended as an aid in the diagnosis of influenza from Nasopharyngeal swab specimens and should not be used as a sole basis for treatment. Nasal washings and aspirates are unacceptable for Xpert Xpress SARS-CoV-2/FLU/RSV testing.  Fact Sheet for Patients: EntrepreneurPulse.com.au  Fact Sheet for Healthcare Providers: IncredibleEmployment.be  This test is not yet approved or cleared by the [place] FDA and has been authorized for detection and/or diagnosis of SARS-CoV-2 by FDA  under an Emergency Use Authorization (EUA). This EUA will remain in effect (meaning this test can be used) for the duration of the COVID-19 declaration under Section 564(b)(1) of the Act, 21 U.S.C. section 360bbb-3(b)(1), unless the authorization is terminated or revoked.  Performed at [facility], [address] [place], [place] [address]      Time coordinating discharge: Over 30 minutes  SIGNED:   Wyvonnia Dusky, MD  [facility] 12/23/2020, 11:42 AM Pager   If 7PM-7AM, please contact night-coverage

## 2020-12-23 NOTE — Progress Notes (Signed)
Patient able to tolerate breakfast with no nausea/vomiting. NAD at this time.

## 2020-12-23 NOTE — ED Notes (Signed)
Cup of OJ provided to pt for low blood sugar. Will recheck after pt finishes beverage.

## 2021-08-16 ENCOUNTER — Encounter: Payer: Self-pay | Admitting: Podiatry

## 2021-08-16 ENCOUNTER — Ambulatory Visit (INDEPENDENT_AMBULATORY_CARE_PROVIDER_SITE_OTHER): Payer: Federal, State, Local not specified - PPO | Admitting: Podiatry

## 2021-08-16 ENCOUNTER — Ambulatory Visit (INDEPENDENT_AMBULATORY_CARE_PROVIDER_SITE_OTHER): Payer: Federal, State, Local not specified - PPO

## 2021-08-16 DIAGNOSIS — S92502A Displaced unspecified fracture of left lesser toe(s), initial encounter for closed fracture: Secondary | ICD-10-CM

## 2021-08-16 DIAGNOSIS — M2042 Other hammer toe(s) (acquired), left foot: Secondary | ICD-10-CM

## 2021-08-16 NOTE — Progress Notes (Signed)
Subjective:  Patient ID: Cheyenne Davenport, female    DOB: 1990-10-01,  MRN: [id number]  Chief Complaint  Patient presents with   Toe Pain    "I broke my toe."    31 y.o. female presents with the above complaint.  Patient presents with complaint left fourth digit fracture.  Patient states pain for touch is progressive gotten worse.  She is a diabetic with A1c of 10%.  She wanted get it evaluated make sure she has not broken anything she has not seen and was prior to seeing me.  She denies any other acute complaints.  Hurts with ambulation.  She states she broke it about 3 weeks ago on 08/02/2021   Review of Systems: Negative except as noted in the HPI. Denies N/V/F/Ch.  Past Medical History:  Diagnosis Date   Anxiety    Depression    Diabetes mellitus without complication (HCC)    type 1    Hypertension     Current Outpatient Medications:    busPIRone (BUSPAR) 10 MG tablet, Take 10 mg by mouth 2 (two) times daily., Disp: , Rfl:    carvedilol (COREG) 25 MG tablet, Take 25 mg by mouth 2 (two) times daily with a meal., Disp: , Rfl:    insulin aspart protamine- aspart (NOVOLOG MIX 70/30) (70-30) 100 UNIT/ML injection, Inject 0.16 mLs (16 Units total) into the skin 2 (two) times daily with a meal. Breakfast and dinner. (Patient taking differently: Inject 18 Units into the skin 2 (two) times daily with a meal. Breakfast and dinner.), Disp: 28.8 mL, Rfl: 0   irbesartan (AVAPRO) 75 MG tablet, Take 75 mg by mouth daily., Disp: , Rfl:    metFORMIN (GLUCOPHAGE-XR) 500 MG 24 hr tablet, Take 1,000 mg by mouth 2 (two) times daily., Disp: , Rfl:    NIFEdipine (PROCARDIA XL/NIFEDICAL-XL) 90 MG 24 hr tablet, Take 90 mg by mouth daily., Disp: , Rfl:    norgestimate-ethinyl estradiol (ORTHO-CYCLEN) 0.25-35 MG-MCG tablet, Take 1 tablet by mouth daily., Disp: , Rfl:    sertraline (ZOLOFT) 50 MG tablet, Take 100 mg by mouth daily., Disp: , Rfl:    traZODone (DESYREL) 50 MG tablet, Take 50 mg by mouth at  bedtime as needed for sleep., Disp: , Rfl:   Social History   Tobacco Use  Smoking Status Every Day   Packs/day: 0.50   Types: Cigarettes  Smokeless Tobacco Never    Allergies  Allergen Reactions   Shellfish-Derived Products Hives   Lisinopril Nausea Only and Nausea And Vomiting   Objective:  There were no vitals filed for this visit. There is no height or weight on file to calculate BMI. Constitutional Well developed. Well nourished.  Vascular Dorsalis pedis pulses palpable bilaterally. Posterior tibial pulses palpable bilaterally. Capillary refill normal to all digits.  No cyanosis or clubbing noted. Pedal hair growth normal.  Neurologic Normal speech. Oriented to person, place, and time. Epicritic sensation to light touch grossly present bilaterally.  Dermatologic Nails well groomed and normal in appearance. No open wounds. No skin lesions.  Orthopedic: Pain on palpation left fourth digit at the metatarsophalangeal joint.  No pain at the PIPJ joint.  No extensor or flexor tendinitis noted.   Radiographs: 3 views of skeletally mature adult left foot: Fracture noted at the base of the proximal phalanx minimally displaced well aligned Assessment:   1. Closed fracture of phalanx of left fourth toe, initial encounter    Plan:  Patient was evaluated and treated and all questions answered.  Left fourth digit fracture -All questions or concerns discussed with the patient in extensive detail -Given the amount of pain that she is having she will benefit from immobilization.  Surgical shoe was dispensed.  I have asked her to take 4 to 6 weeks to completely resolve and the pain goes away. -Surgical shoe was dispensed -Buddy splint instructions were shown  No follow-ups on file.

## 2021-08-17 ENCOUNTER — Telehealth: Payer: Self-pay | Admitting: Podiatry

## 2021-08-17 ENCOUNTER — Telehealth: Payer: Self-pay | Admitting: *Deleted

## 2021-08-17 NOTE — Telephone Encounter (Signed)
"  I was seen there yesterday.  They said I could return to work with the boot on but the boss just let me know I have to have a toe cover.  I was wondering what my options could be or could you all provide a toe cover or a different kind of boot."

## 2021-08-17 NOTE — Telephone Encounter (Signed)
Patient needs a closed toe boot to be able to return to work, she can not go in with the open toes

## 2021-08-18 ENCOUNTER — Encounter: Payer: Self-pay | Admitting: Podiatry

## 2021-08-18 NOTE — Telephone Encounter (Signed)
Pt called back today to see about a different boot that would cover your toe. I did see the note and told her she could purchase a to cover on [place]. She is going to look for one but may come by the office for a note for work. She is a [facility] pt.

## 2022-11-11 ENCOUNTER — Emergency Department: Payer: Federal, State, Local not specified - PPO

## 2022-11-11 ENCOUNTER — Emergency Department
Admission: EM | Admit: 2022-11-11 | Discharge: 2022-11-11 | Disposition: A | Discharge status: Home / Self Care | Payer: Federal, State, Local not specified - PPO | Attending: Emergency Medicine | Admitting: Emergency Medicine

## 2022-11-11 ENCOUNTER — Other Ambulatory Visit: Payer: Self-pay

## 2022-11-11 DIAGNOSIS — E119 Type 2 diabetes mellitus without complications: Secondary | ICD-10-CM | POA: Insufficient documentation

## 2022-11-11 DIAGNOSIS — I10 Essential (primary) hypertension: Secondary | ICD-10-CM | POA: Diagnosis not present

## 2022-11-11 DIAGNOSIS — R509 Fever, unspecified: Secondary | ICD-10-CM | POA: Diagnosis present

## 2022-11-11 DIAGNOSIS — K529 Noninfective gastroenteritis and colitis, unspecified: Secondary | ICD-10-CM | POA: Diagnosis not present

## 2022-11-11 DIAGNOSIS — U071 COVID-19: Secondary | ICD-10-CM | POA: Insufficient documentation

## 2022-11-11 LAB — COMPREHENSIVE METABOLIC PANEL
ALT: 23 U/L (ref 0–44)
AST: 24 U/L (ref 15–41)
Albumin: 3.5 g/dL (ref 3.5–5.0)
Alkaline Phosphatase: 63 U/L (ref 38–126)
Anion gap: 12 (ref 5–15)
BUN: 22 mg/dL — ABNORMAL HIGH (ref 6–20)
CO2: 21 mmol/L — ABNORMAL LOW (ref 22–32)
Calcium: 9 mg/dL (ref 8.9–10.3)
Chloride: 99 mmol/L (ref 98–111)
Creatinine, Ser: 1.27 mg/dL — ABNORMAL HIGH (ref 0.44–1.00)
GFR, Estimated: 57 mL/min — ABNORMAL LOW (ref >60)
Glucose, Bld: 351 mg/dL — ABNORMAL HIGH (ref 70–99)
Potassium: 4.8 mmol/L (ref 3.5–5.1)
Sodium: 132 mmol/L — ABNORMAL LOW (ref 135–145)
Total Bilirubin: 1.1 mg/dL (ref 0.3–1.2)
Total Protein: 8.2 g/dL — ABNORMAL HIGH (ref 6.5–8.1)

## 2022-11-11 LAB — CBG MONITORING, ED
Glucose-Capillary: 333 mg/dL — ABNORMAL HIGH (ref 70–99)
Glucose-Capillary: 280 mg/dL — ABNORMAL HIGH (ref 70–99)

## 2022-11-11 LAB — CBC
HCT: 40.8 % (ref 36.0–46.0)
Hemoglobin: 13.5 g/dL (ref 12.0–15.0)
MCH: 29.9 pg (ref 26.0–34.0)
MCHC: 33.1 g/dL (ref 30.0–36.0)
MCV: 90.5 fL (ref 80.0–100.0)
Platelets: 193 10*3/uL (ref 150–400)
RBC: 4.51 MIL/uL (ref 3.87–5.11)
RDW: 12.9 % (ref 11.5–15.5)
WBC: 9.1 10*3/uL (ref 4.0–10.5)
nRBC: 0 % (ref 0.0–0.2)

## 2022-11-11 LAB — RESP PANEL BY RT-PCR (RSV, FLU A&B, COVID)  RVPGX2
Influenza A by PCR: NEGATIVE
Influenza B by PCR: NEGATIVE
Resp Syncytial Virus by PCR: NEGATIVE
SARS Coronavirus 2 by RT PCR: POSITIVE — AB

## 2022-11-11 LAB — HCG, QUANTITATIVE, PREGNANCY: hCG, Beta Chain, Quant, S: 1 m[IU]/mL (ref <5)

## 2022-11-11 MED ORDER — INSULIN ASPART 100 UNIT/ML IJ SOLN
2.0000 [IU] | Freq: Once | INTRAMUSCULAR | Status: AC
  Administered 2022-11-11: 2 [IU] via SUBCUTANEOUS
  Filled 2022-11-11: qty 1

## 2022-11-11 MED ORDER — MORPHINE SULFATE (PF) 4 MG/ML IV SOLN
4.0000 mg | Freq: Once | INTRAVENOUS | Status: AC
  Administered 2022-11-11: 4 mg via INTRAVENOUS
  Filled 2022-11-11: qty 1

## 2022-11-11 MED ORDER — IOHEXOL 300 MG/ML  SOLN
100.0000 mL | Freq: Once | INTRAMUSCULAR | Status: AC | PRN
  Administered 2022-11-11: 80 mL via INTRAVENOUS

## 2022-11-11 MED ORDER — FLUORESCEIN SODIUM 1 MG OP STRP
1.0000 | ORAL_STRIP | Freq: Once | OPHTHALMIC | Status: AC
  Administered 2022-11-11: 1 via OPHTHALMIC
  Filled 2022-11-11: qty 1

## 2022-11-11 MED ORDER — DICYCLOMINE HCL 10 MG PO CAPS
20.0000 mg | ORAL_CAPSULE | Freq: Once | ORAL | Status: AC
  Administered 2022-11-11: 20 mg via ORAL
  Filled 2022-11-11: qty 2

## 2022-11-11 MED ORDER — KETOROLAC TROMETHAMINE 30 MG/ML IJ SOLN
30.0000 mg | Freq: Once | INTRAMUSCULAR | Status: AC
  Administered 2022-11-11: 30 mg via INTRAVENOUS
  Filled 2022-11-11: qty 1

## 2022-11-11 MED ORDER — METOCLOPRAMIDE HCL 5 MG/ML IJ SOLN
10.0000 mg | Freq: Once | INTRAMUSCULAR | Status: AC
  Administered 2022-11-11: 10 mg via INTRAVENOUS
  Filled 2022-11-11: qty 2

## 2022-11-11 MED ORDER — ONDANSETRON 4 MG PO TBDP
4.0000 mg | ORAL_TABLET | Freq: Once | ORAL | Status: AC
  Administered 2022-11-11: 4 mg via ORAL
  Filled 2022-11-11: qty 1

## 2022-11-11 MED ORDER — DICYCLOMINE HCL 10 MG PO CAPS: 10.0000 mg | ORAL_CAPSULE | Freq: Three times a day (TID) | ORAL | 0 refills | Status: AC

## 2022-11-11 MED ORDER — METOCLOPRAMIDE HCL 10 MG PO TABS
10.0000 mg | ORAL_TABLET | Freq: Three times a day (TID) | ORAL | 0 refills | Status: AC
Start: 2022-11-11 — End: 2022-11-21

## 2022-11-11 NOTE — Discharge Instructions (Addendum)
Your exam, labs, and CT scan are overall normal and reassuring. You have evidence a mild colitis, which is the cause of your abdominal pain and dark stools. Take the prescription meds as directed. Follow-up with your primary provider or specialist as discussed.

## 2022-11-11 NOTE — ED Provider Notes (Signed)
[facility] Emergency Department Provider Note     Event Date/Time   First MD Initiated Contact with Patient 11/11/22 1605     (approximate)   History   Fever   HPI  Cheyenne Davenport is a 32 y.o. female with history of diabetes, hypertension, anxiety, depression presents to the ED via EMS from home.  Patient would endorse 1 week of intermittent fevers.  She would also endorse some cough and congestion.  By report she is also had some dark stools over the last 3 days denies any watery stools or diarrhea.  Patient would also endorse some acute, crampy abdominal pain, generalized in nature with associated vomiting which prompted her to call EMS.  Physical Exam   Triage Vital Signs: ED Triage Vitals  Encounter Vitals Group     BP 11/11/22 1450 (!) 192/119     Systolic BP Percentile --      Diastolic BP Percentile --      Pulse Rate 11/11/22 1450 87     Resp 11/11/22 1450 17     Temp 11/11/22 1450 98.1 F (36.7 C)     Temp Source 11/11/22 1450 Oral     SpO2 11/11/22 1450 100 %     Weight 11/11/22 1451 210 lb 1.6 oz (95.3 kg)     Height 11/11/22 1451 5\' 4"  (1.626 m)     Head Circumference --      Peak Flow --      Pain Score 11/11/22 1451 7     Pain Loc --      Pain Education --      Exclude from Growth Chart --     Most recent vital signs: Vitals:   11/11/22 2052 11/11/22 2104  BP: (!) 175/93 (!) 167/88  Pulse: 79 82  Resp: 17 18  Temp: 98.7 F (37.1 C) 97.9 F (36.6 C)  SpO2: 99% 98%    General Awake, no distress. NAD HEENT NCAT. PERRL. EOMI. No rhinorrhea. Mucous membranes are moist.  CV:  Good peripheral perfusion. RRR RESP:  Normal effort. CTA ABD:  No distention.  Soft and tender to palpation over the upper quadrants.  Normal bowel sounds appreciated.   ED Results / Procedures / Treatments   Labs (all labs ordered are listed, but only abnormal results are displayed) Labs Reviewed  RESP PANEL BY RT-PCR (RSV, FLU A&B, COVID)   RVPGX2 - Abnormal; Notable for the following components:      Result Value   SARS Coronavirus 2 by RT PCR POSITIVE (*)    All other components within normal limits  COMPREHENSIVE METABOLIC PANEL - Abnormal; Notable for the following components:   Sodium 132 (*)    CO2 21 (*)    Glucose, Bld 351 (*)    BUN 22 (*)    Creatinine, Ser 1.27 (*)    Total Protein 8.2 (*)    GFR, Estimated 57 (*)    All other components within normal limits  CBG MONITORING, ED - Abnormal; Notable for the following components:   Glucose-Capillary 333 (*)    All other components within normal limits  CBG MONITORING, ED - Abnormal; Notable for the following components:   Glucose-Capillary 280 (*)    All other components within normal limits  CBC  HCG, QUANTITATIVE, PREGNANCY     EKG   RADIOLOGY  I personally viewed and evaluated these images as part of my medical decision making, as well as reviewing the written report by the  radiologist.  ED Provider Interpretation: Evidence of colitis without SBO  CT ABDOMEN PELVIS W CONTRAST  Result Date: 11/11/2022 CLINICAL DATA:  Fever for 1 week. Dark stools for 3 days. Abdominal pain and vomiting. EXAM: CT ABDOMEN AND PELVIS WITH CONTRAST TECHNIQUE: Multidetector CT imaging of the abdomen and pelvis was performed using the standard protocol following bolus administration of intravenous contrast. RADIATION DOSE REDUCTION: This exam was performed according to the departmental dose-optimization program which includes automated exposure control, adjustment of the mA and/or kV according to patient size and/or use of iterative reconstruction technique. CONTRAST:  80mL OMNIPAQUE IOHEXOL 300 MG/ML  SOLN COMPARISON:  CT 12/08/2020 FINDINGS: Lower chest: No acute abnormality. Hepatobiliary: Unremarkable liver. Normal gallbladder. No biliary dilation. Pancreas: Unremarkable. Spleen: Unremarkable. Adrenals/Urinary Tract: Normal adrenal glands. No urinary calculi or  hydronephrosis. Bladder is unremarkable. Stomach/Bowel: Normal caliber large and small bowel. Similar diffuse submucosal thickening of the colon and rectum with sparing of the sigmoid colon. The appendix is normal.Stomach is within normal limits. Vascular/Lymphatic: No significant vascular findings are present. No enlarged abdominal or pelvic lymph nodes. Reproductive: Unremarkable. Other: No free intraperitoneal fluid or air. Musculoskeletal: No acute fracture. IMPRESSION: Similar diffuse submucosal thickening of the colon and rectum with sparing of the sigmoid colon. Findings are suggestive of mild colitis. Electronically Signed   By: Minerva Fester M.D.   On: 11/11/2022 18:58     PROCEDURES:  Critical Care performed: No  Procedures   MEDICATIONS ORDERED IN ED: Medications  ondansetron (ZOFRAN-ODT) disintegrating tablet 4 mg (4 mg Oral Given 11/11/22 1531)  morphine (PF) 4 MG/ML injection 4 mg (4 mg Intravenous Given 11/11/22 1636)  metoCLOPramide (REGLAN) injection 10 mg (10 mg Intravenous Given 11/11/22 1639)  fluorescein ophthalmic strip 1 strip (1 strip Left Eye Given 11/11/22 1703)  iohexol (OMNIPAQUE) 300 MG/ML solution 100 mL (80 mLs Intravenous Contrast Given 11/11/22 1735)  dicyclomine (BENTYL) capsule 20 mg (20 mg Oral Given 11/11/22 2036)  ketorolac (TORADOL) 30 MG/ML injection 30 mg (30 mg Intravenous Given 11/11/22 2015)  insulin aspart (novoLOG) injection 2 Units (2 Units Subcutaneous Given 11/11/22 2037)     IMPRESSION / MDM / ASSESSMENT AND PLAN / ED COURSE  I reviewed the triage vital signs and the nursing notes.                              Differential diagnosis includes, but is not limited to, COVID, flu, RSV, CAP, viral URI ovarian cyst, ovarian torsion, acute appendicitis, diverticulitis, urinary tract infection/pyelonephritis, endometriosis, bowel obstruction, colitis, renal colic, gastroenteritis, hernia, fibroids, pregnancy related pain including ectopic  pregnancy, etc.  Patient's presentation is most consistent with acute complicated illness / injury requiring diagnostic workup.  Patient's diagnosis is consistent with COVID with acute colonic colitis.  Patient presents in no acute distress but exhibiting significant pain response to a generalized abdominal pain.  She was evaluated for complaints in the ED, found have reassuring lab workup overall with evidence of mild dehydration.  COVID PCR was positive on presentation.  Remaining labs were unremarkable.  CT scan did confirm some evidence of colitis without other intra-abdominal processes noted based on interpretation.  Patient endorsing significant movement of her symptoms after ED medication administration.  She tolerated p.o. without subsequent nausea and vomiting.  Patient will be discharged home with prescriptions for Reglan and Bentyl. Patient is to follow up with her primary provider as discussed, as needed or otherwise directed. Patient is given  ED precautions to return to the ED for any worsening or new symptoms.   FINAL CLINICAL IMPRESSION(S) / ED DIAGNOSES   Final diagnoses:  COVID  Colitis     Rx / DC Orders   ED Discharge Orders          Ordered    dicyclomine (BENTYL) 10 MG capsule  3 times daily before meals        11/11/22 1944    metoCLOPramide (REGLAN) 10 MG tablet  3 times daily with meals        11/11/22 1944             Note:  This document was prepared using Dragon voice recognition software and may include unintentional dictation errors.    Lissa Hoard, PA-C 11/11/22 2133    Sharman Cheek, MD 11/12/22 [phone]

## 2022-11-11 NOTE — ED Triage Notes (Signed)
Pt here via [facility] with a fever x1 week. Pt also having dark stools x3 days, hx of hemorrhoids, htn, DM. Pt states she has been very congested with body aches but no coughing. Pt also states she had some abd pain and then vomited which prompted her to call ems.     98.0-temp 178/123 296-cbg 84 100%
